# Patient Record
Sex: Female | Born: 1940 | Race: White | Hispanic: No | Marital: Married | Smoking: Former smoker
Health system: Southern US, Community
[De-identification: ages and names within clinical notes are randomized; demographics above are authoritative.]

## PROBLEM LIST (undated history)

## (undated) DIAGNOSIS — K7581 Nonalcoholic steatohepatitis (NASH): Secondary | ICD-10-CM

## (undated) DIAGNOSIS — C801 Malignant (primary) neoplasm, unspecified: Secondary | ICD-10-CM

## (undated) DIAGNOSIS — E119 Type 2 diabetes mellitus without complications: Secondary | ICD-10-CM

## (undated) DIAGNOSIS — I773 Arterial fibromuscular dysplasia: Secondary | ICD-10-CM

## (undated) DIAGNOSIS — T7840XA Allergy, unspecified, initial encounter: Secondary | ICD-10-CM

## (undated) DIAGNOSIS — I10 Essential (primary) hypertension: Secondary | ICD-10-CM

## (undated) DIAGNOSIS — I639 Cerebral infarction, unspecified: Secondary | ICD-10-CM

## (undated) HISTORY — DX: Type 2 diabetes mellitus without complications: E11.9

## (undated) HISTORY — DX: Allergy, unspecified, initial encounter: T78.40XA

## (undated) HISTORY — DX: Essential (primary) hypertension: I10

## (undated) HISTORY — PX: BREAST EXCISIONAL BIOPSY: SUR124

---

## 2001-12-14 ENCOUNTER — Other Ambulatory Visit: Admission: RE | Admit: 2001-12-14 | Discharge: 2001-12-14 | Payer: Self-pay | Admitting: Gynecology

## 2002-03-01 ENCOUNTER — Encounter: Payer: Self-pay | Admitting: Gastroenterology

## 2002-03-10 ENCOUNTER — Encounter (INDEPENDENT_AMBULATORY_CARE_PROVIDER_SITE_OTHER): Payer: Self-pay | Admitting: Specialist

## 2002-03-10 ENCOUNTER — Ambulatory Visit (HOSPITAL_COMMUNITY): Admission: RE | Admit: 2002-03-10 | Discharge: 2002-03-10 | Payer: Self-pay | Admitting: Gastroenterology

## 2003-01-03 ENCOUNTER — Other Ambulatory Visit: Admission: RE | Admit: 2003-01-03 | Discharge: 2003-01-03 | Payer: Self-pay | Admitting: Gynecology

## 2003-02-13 ENCOUNTER — Ambulatory Visit (HOSPITAL_COMMUNITY): Admission: RE | Admit: 2003-02-13 | Discharge: 2003-02-13 | Payer: Self-pay | Admitting: Gastroenterology

## 2003-02-13 ENCOUNTER — Encounter: Payer: Self-pay | Admitting: Gastroenterology

## 2003-11-30 ENCOUNTER — Ambulatory Visit (HOSPITAL_COMMUNITY): Admission: RE | Admit: 2003-11-30 | Discharge: 2003-11-30 | Payer: Self-pay | Admitting: General Surgery

## 2003-11-30 ENCOUNTER — Encounter (INDEPENDENT_AMBULATORY_CARE_PROVIDER_SITE_OTHER): Payer: Self-pay | Admitting: *Deleted

## 2003-11-30 ENCOUNTER — Ambulatory Visit (HOSPITAL_BASED_OUTPATIENT_CLINIC_OR_DEPARTMENT_OTHER): Admission: RE | Admit: 2003-11-30 | Discharge: 2003-11-30 | Payer: Self-pay | Admitting: General Surgery

## 2004-04-07 ENCOUNTER — Ambulatory Visit: Payer: Self-pay | Admitting: Pain Medicine

## 2004-04-28 ENCOUNTER — Ambulatory Visit: Payer: Self-pay | Admitting: Pain Medicine

## 2004-05-06 ENCOUNTER — Ambulatory Visit: Payer: Self-pay | Admitting: Pain Medicine

## 2004-07-22 ENCOUNTER — Encounter: Payer: Self-pay | Admitting: Neurosurgery

## 2004-08-06 ENCOUNTER — Encounter: Payer: Self-pay | Admitting: Neurosurgery

## 2004-09-03 ENCOUNTER — Encounter: Payer: Self-pay | Admitting: Neurosurgery

## 2005-02-04 ENCOUNTER — Other Ambulatory Visit: Admission: RE | Admit: 2005-02-04 | Discharge: 2005-02-04 | Payer: Self-pay | Admitting: Gynecology

## 2005-04-08 ENCOUNTER — Ambulatory Visit: Payer: Self-pay | Admitting: Otolaryngology

## 2005-06-03 ENCOUNTER — Ambulatory Visit: Payer: Self-pay

## 2005-07-06 DIAGNOSIS — C801 Malignant (primary) neoplasm, unspecified: Secondary | ICD-10-CM

## 2005-07-06 DIAGNOSIS — Z85038 Personal history of other malignant neoplasm of large intestine: Secondary | ICD-10-CM | POA: Insufficient documentation

## 2005-07-06 HISTORY — DX: Malignant (primary) neoplasm, unspecified: C80.1

## 2006-02-24 ENCOUNTER — Encounter: Admission: RE | Admit: 2006-02-24 | Discharge: 2006-02-24 | Payer: Self-pay | Admitting: Gastroenterology

## 2006-03-01 ENCOUNTER — Other Ambulatory Visit: Admission: RE | Admit: 2006-03-01 | Discharge: 2006-03-01 | Payer: Self-pay | Admitting: Gynecology

## 2006-05-07 ENCOUNTER — Ambulatory Visit: Payer: Self-pay | Admitting: Gynecology

## 2006-06-15 ENCOUNTER — Ambulatory Visit: Payer: Self-pay | Admitting: Gynecology

## 2006-12-22 ENCOUNTER — Ambulatory Visit: Payer: Self-pay | Admitting: Otolaryngology

## 2007-01-12 ENCOUNTER — Ambulatory Visit: Payer: Self-pay | Admitting: Otolaryngology

## 2007-02-28 ENCOUNTER — Ambulatory Visit: Payer: Self-pay | Admitting: Unknown Physician Specialty

## 2007-03-10 ENCOUNTER — Other Ambulatory Visit: Admission: RE | Admit: 2007-03-10 | Discharge: 2007-03-10 | Payer: Self-pay | Admitting: Gynecology

## 2007-04-06 ENCOUNTER — Ambulatory Visit: Payer: Self-pay | Admitting: Anesthesiology

## 2007-04-11 ENCOUNTER — Encounter: Payer: Self-pay | Admitting: Anesthesiology

## 2007-04-14 ENCOUNTER — Ambulatory Visit: Payer: Self-pay | Admitting: Anesthesiology

## 2007-04-28 ENCOUNTER — Ambulatory Visit: Payer: Self-pay | Admitting: Anesthesiology

## 2007-05-07 ENCOUNTER — Encounter: Payer: Self-pay | Admitting: Anesthesiology

## 2007-05-11 ENCOUNTER — Ambulatory Visit: Payer: Self-pay | Admitting: Anesthesiology

## 2007-06-17 ENCOUNTER — Ambulatory Visit: Payer: Self-pay | Admitting: Gynecology

## 2007-07-07 DIAGNOSIS — I10 Essential (primary) hypertension: Secondary | ICD-10-CM | POA: Insufficient documentation

## 2007-09-21 ENCOUNTER — Ambulatory Visit: Payer: Self-pay

## 2007-10-05 ENCOUNTER — Ambulatory Visit: Payer: Self-pay

## 2008-04-16 ENCOUNTER — Ambulatory Visit: Payer: Self-pay | Admitting: Gynecology

## 2008-04-16 ENCOUNTER — Other Ambulatory Visit: Admission: RE | Admit: 2008-04-16 | Discharge: 2008-04-16 | Payer: Self-pay | Admitting: Gynecology

## 2008-04-16 ENCOUNTER — Encounter: Payer: Self-pay | Admitting: Gynecology

## 2008-05-08 ENCOUNTER — Ambulatory Visit: Payer: Self-pay | Admitting: Gynecology

## 2008-06-20 ENCOUNTER — Ambulatory Visit: Payer: Self-pay | Admitting: Gynecology

## 2008-11-13 ENCOUNTER — Ambulatory Visit: Payer: Self-pay | Admitting: Anesthesiology

## 2008-12-05 ENCOUNTER — Ambulatory Visit: Payer: Self-pay | Admitting: Anesthesiology

## 2009-01-01 ENCOUNTER — Ambulatory Visit: Payer: Self-pay | Admitting: Anesthesiology

## 2009-02-05 ENCOUNTER — Emergency Department: Payer: Self-pay | Admitting: Emergency Medicine

## 2009-04-11 ENCOUNTER — Ambulatory Visit: Payer: Self-pay | Admitting: Obstetrics and Gynecology

## 2009-04-17 ENCOUNTER — Other Ambulatory Visit: Admission: RE | Admit: 2009-04-17 | Discharge: 2009-04-17 | Payer: Self-pay | Admitting: Gynecology

## 2009-04-17 ENCOUNTER — Encounter: Payer: Self-pay | Admitting: Gynecology

## 2009-04-17 ENCOUNTER — Ambulatory Visit: Payer: Self-pay | Admitting: Gynecology

## 2009-05-08 ENCOUNTER — Ambulatory Visit: Payer: Self-pay | Admitting: Women's Health

## 2009-05-28 ENCOUNTER — Encounter: Payer: Self-pay | Admitting: Infectious Diseases

## 2009-06-19 ENCOUNTER — Ambulatory Visit: Payer: Self-pay | Admitting: Gynecology

## 2009-07-08 ENCOUNTER — Ambulatory Visit: Payer: Self-pay | Admitting: Gynecology

## 2009-07-23 ENCOUNTER — Ambulatory Visit: Payer: Self-pay | Admitting: Gynecology

## 2009-07-30 ENCOUNTER — Ambulatory Visit: Payer: Self-pay | Admitting: Infectious Diseases

## 2009-07-30 DIAGNOSIS — N94810 Vulvar vestibulitis: Secondary | ICD-10-CM | POA: Insufficient documentation

## 2009-07-31 ENCOUNTER — Encounter: Payer: Self-pay | Admitting: Infectious Diseases

## 2009-09-05 ENCOUNTER — Encounter: Payer: Self-pay | Admitting: Infectious Diseases

## 2009-09-10 ENCOUNTER — Encounter: Payer: Self-pay | Admitting: Infectious Diseases

## 2009-10-23 ENCOUNTER — Ambulatory Visit: Payer: Self-pay | Admitting: Anesthesiology

## 2009-11-26 ENCOUNTER — Ambulatory Visit: Payer: Self-pay | Admitting: Anesthesiology

## 2009-12-30 ENCOUNTER — Ambulatory Visit: Payer: Self-pay | Admitting: Anesthesiology

## 2010-08-01 ENCOUNTER — Ambulatory Visit: Payer: Self-pay

## 2010-08-05 NOTE — Letter (Signed)
Summary: [facility],  [facility],   Imported By: Florinda Marker 09/26/2009 15:33:26  _____________________________________________________________________  External Attachment:    Type:   Image     Comment:   External Document

## 2010-08-05 NOTE — Consult Note (Signed)
Summary: New Pt. Referral:[facility]  New Pt. Referral:[facility]   Imported By: Florinda Marker 07/31/2009 15:14:07  _____________________________________________________________________  External Attachment:    Type:   Image     Comment:   External Document

## 2010-08-05 NOTE — Consult Note (Signed)
Summary: [facility]   Imported By: Florinda Marker 09/05/2009 16:55:59  _____________________________________________________________________  External Attachment:    Type:   Image     Comment:   External Document

## 2010-08-05 NOTE — Assessment & Plan Note (Signed)
Summary: new pt recurrent yeast   CC:  new patient / recurrent yeast.  History of Present Illness: PCP Alphonzo Lemmings - [facility] Dr Ardine Eng - GI liver - [facility] Dr Chauncey Mann - gi surgery Gyn - Dr Audie Box  70 yo with long standing history of vaginal yeast infections for her whole adult life.   Referred by Dr Audie Box for ID evaluation  Also has histroy off fatigue depression, gerd, allergic rhinitis.  Had colon cancer with hemicolectomy 2007 at [facility].   Has had multiple courses of diflucan for the vaginitis  Preventive Screening-Counseling & Management  Alcohol-Tobacco     Alcohol drinks/day: occassionally     Smoking Status: never  Caffeine-Diet-Exercise     Caffeine use/day: 0     Does Patient Exercise: yes     Type of exercise: gym membership     Exercise (avg: min/session): 30-60     Times/week: 3  Safety-Violence-Falls     Seat Belt Use: yes   Prior Medication List:  No prior medications documented  Current Allergies (reviewed today): ! PCN ! DARVOCET ! EPINEPHRINE ! IBUPROFEN ! * WHEAT INTOLERANCE ! * CONTRAST DYE Past History:  Family History: Last updated: 07/30/2009 no fam hx of colon cancer  Social History: Last updated: 07/30/2009 lives with husband , has 2 sons 5 grandkids no tobao or etoh  Risk Factors: Alcohol Use: occassionally (07/30/2009) Caffeine Use: 0 (07/30/2009) Exercise: yes (07/30/2009)  Risk Factors: Smoking Status: never (07/30/2009)  Past Medical History: Colon cancer 2007- [facility] resection. Depression Allergic rhinitis Labile secondary HTN  Fibromuscular dysplasia - s/p 3 angioplastys - follows with  [facility] - follows [facility] GI at [facility]  Past Surgical History: [facility] Hemicolectomy  Family History: no fam hx of colon cancer  Social History: lives with husband , has 2 sons 5 grandkids no tobao or etoh  Review of Systems       11 systems reviewed and negative except per HPI   Vital Signs:  Patient profile:   70 year old  female Height:      65 inches (165.10 cm) Weight:      153.8 pounds (69.91 kg) BMI:     25.69 Temp:     97.0 degrees F (36.11 degrees C) oral Pulse rate:   62 / minute BP sitting:   183 / 83  (right arm)  Vitals Entered By: Baxter Hire) (July 30, 2009 10:59 AM) CC: new patient / recurrent yeast Is Patient Diabetic? Yes Did you bring your meter with you today? Yes Pain Assessment Patient in pain? no      Nutritional Status BMI of 25 - 29 = overweight Nutritional Status Detail appetite is great per patient  Does patient need assistance? Functional Status Self care Ambulation Normal   Physical Exam  General:  alert and well-developed.   Head:  normocephalic and atraumatic.   Eyes:  vision grossly intact, pupils equal, and pupils round.   Neck:  supple.   Lungs:  normal respiratory effort and normal breath sounds.   Heart:  normal rate, regular rhythm, and no murmur.   Abdomen:  soft, non-tender, and normal bowel sounds.   Msk:  normal ROM, no joint tenderness, and no joint swelling.   Extremities:  no cce Neurologic:  alert & oriented X3, cranial nerves II-XII intact, and strength normal in all extremities.   Skin:  no rashes.     Impression & Recommendations:  Problem # 1:  VULVAR VESTIBULITIS (ICD-625.71) Referred for possible recurrent  vaginal yeast infections.   Likely vulvar vestibulitis not related to candidal infection.    I have sent vaginal yeast culture to eval for possible resistance but I doubt this will give an answer.   I have given recs regarding baking soda baths and lactobacillus supplements.  WIll refer to Dr Tessie Fass who specializes in this condition.   Orders: Consultation Level IV (60454) T-Culture, Fungus w/o Smear ([id number]) Gynecologic Referral (Gyn)  Other Orders: T-HIV Antibody  (Reflex) ([id number])  Patient Instructions: 1)   Take a baking soda bath 4 times a week. 2)  Get refrigerated probiotcs from [facility]. 3)  Call  for new or concerning symptoms.   4)  Follow up with Dr Vincente Poli. Process Orders Check Orders Results:     [facility]: Check successful Tests Sent for requisitioning (July 30, 2009 9:09 PM):     07/30/2009: [facility] -- T-Culture, Fungus w/o Smear [[id number]] (signed)     07/30/2009: [facility] -- T-HIV Antibody  (Reflex) [[id number]] (signed)   Process Orders Check Orders Results:     [facility]: Check successful Tests Sent for requisitioning (July 30, 2009 9:09 PM):     07/30/2009: [facility] -- T-Culture, Fungus w/o Smear [[id number]] (signed)     07/30/2009: [facility] -- T-HIV Antibody  (Reflex) [[id number]] (signed)

## 2010-11-21 NOTE — Op Note (Signed)
NAME:  Whitney [name], Whitney [name]                         ACCOUNT NO.:  [id number]   MEDICAL RECORD NO.:  [id number]                   PATIENT TYPE:  AMB   LOCATION:  DSC                                  FACILITY:  [facility]   PHYSICIAN:  Adolph Pollack, M.D.            DATE OF BIRTH:  [birth year]   DATE OF PROCEDURE:  11/30/2003  DATE OF DISCHARGE:                                 OPERATIVE REPORT   PREOPERATIVE DIAGNOSIS:  Symptomatic external hemorrhoid.   POSTOPERATIVE DIAGNOSIS:  Symptomatic external hemorrhoid.   OPERATION PERFORMED:  External hemorrhoidectomy.   SURGEON:  Adolph Pollack, M.D.   ANESTHESIA:  Plain Marcaine mixed with plain lidocaine.   INDICATIONS FOR PROCEDURE:  The patient is a 70 year old female who has had  an external hemorrhoid becoming increasingly symptomatic.  She has had it  for many years.  It is in the right anterolateral position.  She now  presents for excision of it.  The procedure and risks were discussed with  her preoperatively.   DESCRIPTION OF PROCEDURE:  She was brought to the minor room and placed in  the left lateral Sims position.  Hurricaine cream was placed around the  hemorrhoidal mucosa and the area was then sterilely prepped with Betadine.  A local anesthetic was then injected around the base of the external  hemorrhoid.  The area was sterilely prepped.  The hemorrhoid was then  sharply excised.  The anoderm was then reapproximated with running locking 3-  0 chromic suture with adequate hemostasis.  A bulky dressing was applied.  The patient tolerated the procedure well without any apparent complications  and was sent home in satisfactory condition.  She was given some Ultracet  for pain and also given some activity restrictions as well as postoperative  instructions.  Plan is to see her back again in two or three weeks.                                               Adolph Pollack, M.D.    Kari Baars  D:  11/30/2003  T:   12/01/2003  Job:  [id number]   cc:   Anselmo Rod, M.D.  [address]  [address]  [place]  [place] [address]  Fax: [phone]

## 2010-11-21 NOTE — Op Note (Signed)
TNAMEMIJA, EFFERTZ                        ACCOUNT NO.:  [id number]   MEDICAL RECORD NO.:  [id number]                   PATIENT TYPE:  AMB   LOCATION:  DAY                                  FACILITY:  [facility]   PHYSICIAN:  Charna Elizabeth, M.D.                   DATE OF BIRTH:  [birth year]   DATE OF PROCEDURE:  03/10/2002  DATE OF DISCHARGE:                                 OPERATIVE REPORT   PROCEDURE:  Colonoscopy with biopsies and snare polypectomy x1.   ENDOSCOPIST:  Charna Elizabeth, M.D.   INSTRUMENT USED:  Olympus video colonoscope.   INDICATIONS FOR PROCEDURE:  [age]-year-old white female with a history of  guaiac-positive stools.  Rule out colonic polyps, masses, hemorrhoids, etc.   PREPROCEDURE PREPARATION:  Informed consent was procured from the patient.  The patient was fasted for eight hours prior to the procedure and prepped  with a bottle of magnesium citrate and a gallon of NuLytely the night prior  to the procedure.   PREPROCEDURE PHYSICAL EXAMINATION:  VITAL SIGNS:  The patient had stable  vital signs.  NECK:  Supple.  LUNGS:  Clear to auscultation.  HEART:  S1, S2 regular.  ABDOMEN:  Soft with normal bowel sounds.   DESCRIPTION OF PROCEDURE:  The patient was placed in the left lateral  decubitus position.  She was under general anesthesia because of her  multiple medical allergies and inability to tolerate Demerol and Versed.  The patient has just finished cystoscopy which was done prior to the  colonoscopy.  Once the patient was adequately positioned and maintained on  oxygen, continuous cardiac monitoring, the Olympus video colonoscope was  advanced from the rectum to the cecum without difficulty.  The patient had a  lipomatous-appearing lesion in the mid right colon.  It was biopsied for  pathology.  The appendiceal orifice and ileocecal valve were clearly  visualized and photographed.  A small sessile polyp was snared from 20 cm.  Two small sessile diminutive  polyps were biopsied from 20 cm as well.  There  was no evidence of diverticulosis.  The patient tolerated the procedure well  without complications.   IMPRESSION:  1. Small lipomatous-appearing lesion biopsied from the mid right colon.  2. Small polyp snared from 20 cm.  3. Two small dimunitive polyps biopsied from 20 cm.  4. No evidence of diverticulosis or large masses.    RECOMMENDATIONS:  1. Await pathology results.  2. Avoid all nonsteroidals including aspirin.  3. Outpatient followup in the next two weeks.                                                    Charna Elizabeth, M.D.    JM/MEDQ  D:  03/10/2002  T:  03/10/2002  Job:  [id number]   cc:   Ivor Costa. Farrel Gobble, M.D.  [address] [place]  [place]  [place] [address]  Fax: [phone]   Lucrezia Starch. Ovidio Hanger, M.D.

## 2010-11-21 NOTE — Op Note (Signed)
NAME:  SUHEYLA, MORTELLARO                         ACCOUNT NO.:  [id number]   MEDICAL RECORD NO.:  [id number]                   PATIENT TYPE:  AMB   LOCATION:  DSC                                  FACILITY:  [facility]   PHYSICIAN:  Adolph Pollack, M.D.            DATE OF BIRTH:  [birth year]   DATE OF PROCEDURE:  DATE OF DISCHARGE:                                 OPERATIVE REPORT   Audio too short to transcribe (less than 5 seconds)                                               Adolph Pollack, M.D.    Kari Baars  D:  11/30/2003  T:  11/30/2003  Job:  [id number]

## 2010-11-21 NOTE — Op Note (Signed)
   TNAMEMARISA, [name]                        ACCOUNT NO.:  [id number]   MEDICAL RECORD NO.:  [id number]                   PATIENT TYPE:  AMB   LOCATION:  DAY                                  FACILITY:  [facility]   PHYSICIAN:  Ronald L. Ovidio Hanger, M.D.           DATE OF BIRTH:  1941-02-12   DATE OF PROCEDURE:  03/10/2002  DATE OF DISCHARGE:                                 OPERATIVE REPORT   DIAGNOSIS:  Possible interstitial cystitis.   OPERATIVE PROCEDURE:  1. Cystourethroscopy.  2. Hydraulic bladder distention.  3. Bladder biopsy.   SURGEON:  Lucrezia Starch. Earlene Plater, M.D.   ANESTHESIA:  General endotracheal.   ESTIMATED BLOOD LOSS:  Negligible.   TUBES:  None.   COMPLICATIONS:  None.   FINDINGS:  Submucosal glomerulations and bladder capacity 700 cc at 80 cm of  water.   INDICATIONS FOR PROCEDURE:  The patient is a lovely, 70 year old, white  female, who presents with urgency, frequency, bladder pain, pelvic  discomfort, and has multiple problems.  The situation was quite suspicious  for interstitial cystitis and after understanding risks, benefits, and  alternatives, elected to proceed with the above procedure in conjunction  with colonoscopy.   PROCEDURE IN DETAIL:  The patient was placed in a supine position.  After  proper general endotracheal anesthesia, was placed in the dorsal lithotomy  position and prepped and draped with Betadine in a sterile fashion.  Cystourethroscopy was performed with a 22.5 [place] Olympus panendoscope.  Utilizing the 12 and 70 degree lenses, the bladder was carefully inspected.  There was some grade 1 trabeculation, but efflux of clear urine was noted  from the normally placed ureteral orifices bilaterally.  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  Following this, a biopsy was taken from the  posterior midline with cold cup biopsy forcep, and the base was cauterized  with Bovie coagulation cautery.  The bladder was drained.  The panendoscope  was removed, and the case was turned over to Dr. Loreta Ave for colonoscopy.                                               Ronald L. Ovidio Hanger, M.D.    RLD/MEDQ  D:  03/10/2002  T:  03/10/2002  Job:  [id number]

## 2010-11-21 NOTE — Op Note (Signed)
Whitney [name], SWAMY                        ACCOUNT NO.:  [id number]   MEDICAL RECORD NO.:  [id number]                   PATIENT TYPE:  AMB   LOCATION:  DAY                                  FACILITY:  [facility]   PHYSICIAN:  Charna Elizabeth, M.D.                   DATE OF BIRTH:  December 15, 1940   DATE OF PROCEDURE:  07/05/2002  DATE OF DISCHARGE:                                 OPERATIVE REPORT   PROCEDURE PERFORMED:  Esophagogastroduodenoscopy with biopsies.   ENDOSCOPIST:  Charna Elizabeth, M.D.   INSTRUMENT USED:  Olympus video panendoscope.   INDICATIONS FOR PROCEDURE:  Guaiac-positive stools in a 70 year old white  female.  Rule out peptic ulcer disease, esophagitis, gastritis, etc.   PROCEDURE PREPARATION:  Informed consent was procured from the patient.  The  patient was fasted for eight hours prior to the procedure.   PREPROCEDURE PHYSICAL EXAMINATION:  VITAL SIGNS:  The patient has stable  vital signs.  NECK:  Supple.  CHEST:  Clear to auscultation.  HEART:  S1 and S2 regular.  ABDOMEN:  Soft with normal bowel sounds.   DESCRIPTION OF PROCEDURE:  The patient was placed in the left lateral  decubitus position.  She was general anesthesia.  Conscious sedation was not  used.  Once cystoscopy was completed by Dr. Gaynelle Arabian, the patient  underwent an EGD.  The Olympus video panendoscope was advanced through the  mouthpiece, over the tongue, into the esophagus under direct vision.  The  entire esophagus appeared normal with no evidence of ring, stricture,  masses, esophagitis or Barrett's mucosa.  The scope was then advanced into  the stomach.  There were some edematous gastric folds in the proximal half  of the stomach.  These were biopsied to rule out the presence of  Helicobacter pylori by pathology.  No ulcers, erosions, masses, or polyps  were present.  The proximal small bowel appeared normal as well.   IMPRESSION:  1. Normal appearing esophagus and proximal small bowel.  2. Edematous fold in the proximal stomach biopsied for pathology to rule out     Helicobacter pylori.  3. Normal appearing antrum.  No  ulcers, erosions, masses or polyps seen in     the stomach.    RECOMMENDATIONS:  1. Await pathology results.  2. Proceed with a colonoscopy at this time.                                               Charna Elizabeth, M.D.    JM/MEDQ  D:  03/10/2002  T:  03/10/2002  Job:  [id number]   cc:   Windy Fast L. Ovidio Hanger, M.D.   Ivor Costa. Farrel Gobble, M.D.  [address] [place]  [place]  [place] [address]  Fax: [phone]

## 2011-05-06 DIAGNOSIS — E669 Obesity, unspecified: Secondary | ICD-10-CM | POA: Insufficient documentation

## 2011-05-06 DIAGNOSIS — K760 Fatty (change of) liver, not elsewhere classified: Secondary | ICD-10-CM | POA: Insufficient documentation

## 2011-05-06 DIAGNOSIS — E785 Hyperlipidemia, unspecified: Secondary | ICD-10-CM | POA: Insufficient documentation

## 2011-08-13 ENCOUNTER — Ambulatory Visit: Payer: Self-pay

## 2012-07-26 ENCOUNTER — Ambulatory Visit: Payer: Self-pay | Admitting: Unknown Physician Specialty

## 2012-09-07 ENCOUNTER — Ambulatory Visit: Payer: Self-pay

## 2013-02-08 ENCOUNTER — Ambulatory Visit: Payer: Self-pay

## 2013-03-29 DIAGNOSIS — E559 Vitamin D deficiency, unspecified: Secondary | ICD-10-CM | POA: Insufficient documentation

## 2013-08-03 ENCOUNTER — Other Ambulatory Visit: Payer: Self-pay | Admitting: Obstetrics and Gynecology

## 2014-02-09 ENCOUNTER — Ambulatory Visit: Payer: Self-pay | Admitting: Obstetrics and Gynecology

## 2015-01-10 ENCOUNTER — Ambulatory Visit (INDEPENDENT_AMBULATORY_CARE_PROVIDER_SITE_OTHER): Payer: Medicare Other | Admitting: Podiatry

## 2015-01-10 ENCOUNTER — Encounter: Payer: Self-pay | Admitting: Podiatry

## 2015-01-10 ENCOUNTER — Ambulatory Visit (INDEPENDENT_AMBULATORY_CARE_PROVIDER_SITE_OTHER): Payer: Medicare Other

## 2015-01-10 VITALS — BP 135/64 | HR 70 | Resp 12

## 2015-01-10 DIAGNOSIS — M722 Plantar fascial fibromatosis: Secondary | ICD-10-CM

## 2015-01-10 MED ORDER — TRIAMCINOLONE ACETONIDE 10 MG/ML IJ SUSP
10.0000 mg | Freq: Once | INTRAMUSCULAR | Status: AC
  Administered 2015-01-10: 10 mg

## 2015-01-10 NOTE — Patient Instructions (Addendum)
Plantar Fasciitis  Plantar fasciitis is a common condition that causes foot pain. It is soreness (inflammation) of the band of tough fibrous tissue on the bottom of the foot that runs from the heel bone (calcaneus) to the ball of the foot. The cause of this soreness may be from excessive standing, poor fitting shoes, running on hard surfaces, being overweight, having an abnormal walk, or overuse (this is common in runners) of the painful foot or feet. It is also common in aerobic exercise dancers and ballet dancers.  SYMPTOMS   Most people with plantar fasciitis complain of:   Severe pain in the morning on the bottom of their foot especially when taking the first steps out of bed. This pain recedes after a few minutes of walking.   Severe pain is experienced also during walking following a long period of inactivity.   Pain is worse when walking barefoot or up stairs  DIAGNOSIS    Your caregiver will diagnose this condition by examining and feeling your foot.   Special tests such as X-rays of your foot, are usually not needed.  PREVENTION    Consult a sports medicine professional before beginning a new exercise program.   Walking programs offer a good workout. With walking there is a lower chance of overuse injuries common to runners. There is less impact and less jarring of the joints.   Begin all new exercise programs slowly. If problems or pain develop, decrease the amount of time or distance until you are at a comfortable level.   Wear good shoes and replace them regularly.   Stretch your foot and the heel cords at the back of the ankle (Achilles tendon) both before and after exercise.   Run or exercise on even surfaces that are not hard. For example, asphalt is better than pavement.   Do not run barefoot on hard surfaces.   If using a treadmill, vary the incline.   Do not continue to workout if you have foot or joint problems. Seek professional help if they do not improve.  HOME CARE INSTRUCTIONS     Avoid activities that cause you pain until you recover.   Use ice or cold packs on the problem or painful areas after working out.   Only take over-the-counter or prescription medicines for pain, discomfort, or fever as directed by your caregiver.   Soft shoe inserts or athletic shoes with air or gel sole cushions may be helpful.   If problems continue or become more severe, consult a sports medicine caregiver or your own health care provider. Cortisone is a potent anti-inflammatory medication that may be injected into the painful area. You can discuss this treatment with your caregiver.  MAKE SURE YOU:    Understand these instructions.   Will watch your condition.   Will get help right away if you are not doing well or get worse.  Document Released: 03/17/2001 Document Revised: 09/14/2011 Document Reviewed: 05/16/2008  [employer] Patient Information 2015 [employer]. This information is not intended to replace advice given to you by your health care provider. Make sure you discuss any questions you have with your health care provider.

## 2015-01-10 NOTE — Progress Notes (Signed)
   Subjective:    Patient ID: Whitney [name], female    DOB: [birth year], 74 y.o.   MRN: [id number]  HPI  PT STATED HAVE HISTORY OF PLANTAR FASCIITIS  LT FOOT HEEL AND NOW START BOTHER HER FOR HURTING FOR 1 MONTH. THE HEEL IS GETTING WORSE AND PRESSURE. TRIED NO TREATMENT.  Review of Systems  HENT: Positive for ear pain, hearing loss and sinus pressure.   Eyes: Positive for itching.  Gastrointestinal: Positive for nausea and diarrhea.  Genitourinary: Positive for frequency.  Musculoskeletal: Positive for gait problem.  Allergic/Immunologic: Positive for food allergies.       Objective:   Physical Exam        Assessment & Plan:

## 2015-01-10 NOTE — Progress Notes (Signed)
Subjective:     Patient ID: Rockne Coons, female   DOB: 1940-07-20, 74 y.o.   MRN: [id number]  HPI patient states I'm having a lot of pain in the plantar aspect of my left heel of 4 months duration and I know I'm flat-footed which is contributory   Review of Systems  All other systems reviewed and are negative.      Objective:   Physical Exam  Constitutional: She is oriented to person, place, and time.  Cardiovascular: Intact distal pulses.   Musculoskeletal: Normal range of motion.  Neurological: She is oriented to person, place, and time.  Skin: Skin is warm.  Nursing note and vitals reviewed.  neurovascular status intact muscle strength adequate and range of motion subtalar and midtarsal joint within normal limits. Patient's noted to have inflammation and pain of the plantar heel left insertional point of the tendon into the calcaneus with depression of the arch noted upon weightbearing     Assessment:     Plantar fasciitis left with inflammation and fluid around the medial band    Plan:     H&P and x-rays performed and injected the left plantar fascia 3 mg Kenalog 5 mg Xylocaine and instructed on physical therapy and inflammatory usage. Reappoint in 1 week

## 2015-01-18 ENCOUNTER — Encounter: Payer: Self-pay | Admitting: Podiatry

## 2015-01-18 ENCOUNTER — Ambulatory Visit (INDEPENDENT_AMBULATORY_CARE_PROVIDER_SITE_OTHER): Payer: Medicare Other | Admitting: Podiatry

## 2015-01-18 VITALS — BP 140/72 | HR 59 | Resp 15

## 2015-01-18 DIAGNOSIS — M722 Plantar fascial fibromatosis: Secondary | ICD-10-CM | POA: Diagnosis not present

## 2015-01-18 NOTE — Progress Notes (Signed)
Subjective:     Patient ID: Whitney [name], female   DOB: 1940/10/13, 74 y.o.   MRN: [id number]  HPI patient states my heel is feeling quite a bit better but I do have depression of my arch and I know I need some kind of an insert   Review of Systems     Objective:   Physical Exam Neurovascular status intact muscle strength adequate with depression of the arch noted upon weightbearing with discomfort in the plantar fascia of a mild to moderate nature but significantly improved from previous visit    Assessment:     Mechanical dysfunction of the foot with plantar fasciitis left present    Plan:     H&P and condition explained and advised on physical therapy supportive shoe gear usage. Scanned for custom orthotics to reduce stress against the foot

## 2015-01-18 NOTE — Patient Instructions (Signed)

## 2015-02-07 ENCOUNTER — Ambulatory Visit (INDEPENDENT_AMBULATORY_CARE_PROVIDER_SITE_OTHER): Payer: Medicare Other | Admitting: Podiatry

## 2015-02-07 ENCOUNTER — Encounter: Payer: Self-pay | Admitting: Podiatry

## 2015-02-07 VITALS — BP 145/69 | HR 70 | Resp 18

## 2015-02-07 DIAGNOSIS — M722 Plantar fascial fibromatosis: Secondary | ICD-10-CM

## 2015-02-09 NOTE — Progress Notes (Signed)
Subjective:     Patient ID: Whitney [name], female   DOB: December 12, 1940, 74 y.o.   MRN: [id number]  HPI patient presents stating I'm still getting some pain in my left heel but it seems to be improving slightly and I would like to know what other options there are if it remains tender   Review of Systems     Objective:   Physical Exam Neurovascular status found to be unchanged with muscle strength adequate range of motion adequate and patient's found to be oriented 3. Patient's noted to have continued discomfort in the medial and central band left plantar fascia with mild lateral compensation-like symptoms    Assessment:     Plantar fasciitis of the left heel and central heel band with continued inflammation and moderate depression of the arch noted    Plan:     Reviewed conditions and consideration of shockwave if symptoms persist. Today I went ahead and I scanned for custom orthotic devices that had been made previously and dispensed with all instructions on usage. Reappoint if symptoms continue

## 2015-05-08 DIAGNOSIS — E119 Type 2 diabetes mellitus without complications: Secondary | ICD-10-CM | POA: Insufficient documentation

## 2015-06-03 ENCOUNTER — Encounter: Payer: Self-pay | Admitting: Podiatry

## 2015-06-03 ENCOUNTER — Ambulatory Visit (INDEPENDENT_AMBULATORY_CARE_PROVIDER_SITE_OTHER): Payer: Medicare Other | Admitting: Podiatry

## 2015-06-03 VITALS — BP 158/70 | HR 62 | Resp 16

## 2015-06-03 DIAGNOSIS — M722 Plantar fascial fibromatosis: Secondary | ICD-10-CM

## 2015-06-04 NOTE — Progress Notes (Signed)
Subjective:     Patient ID: Whitney [name], female   DOB: [birth year], 74 y.o.   MRN: [id number]  HPI patient states she is still having pain in the plantar aspect of the left heel and it's not responded all the different methods the we have tried   Review of Systems     Objective:   Physical Exam Neurovascular status intact muscle strength adequate with continued discomfort plantar aspect left heel there is quite painful at the medial cut due to go fascial insertion with inflammation surrounding the area    Assessment:     Acute plantar fasciitis left with pain    Plan:     Reviewed condition and at this time recommended shockwave therapy and educated her on shockwave therapy procedure and complications and recovery. Today I applied air fracture walker that I want her to begin wearing now and she will wear during the postoperative period and she is scheduled for this procedure

## 2015-06-13 ENCOUNTER — Ambulatory Visit (INDEPENDENT_AMBULATORY_CARE_PROVIDER_SITE_OTHER): Payer: Medicare Other | Admitting: Podiatry

## 2015-06-13 ENCOUNTER — Ambulatory Visit: Payer: Medicare Other | Admitting: Podiatry

## 2015-06-13 DIAGNOSIS — M722 Plantar fascial fibromatosis: Secondary | ICD-10-CM

## 2015-06-16 NOTE — Progress Notes (Signed)
Subjective:     Patient ID: Whitney [name], female   DOB: [birth year], 74 y.o.   MRN: [id number]  HPI patient presents for shockwave therapy left   Review of Systems     Objective:   Physical Exam Neurovascular status intact with discomfort in the plantar heel left at the insertion of calcaneus    Assessment:     Planter fasciitis left with inflammation    Plan:     Shockwave administered 3000 shocks 17 frequency 4.0 intensity

## 2015-06-18 ENCOUNTER — Ambulatory Visit (INDEPENDENT_AMBULATORY_CARE_PROVIDER_SITE_OTHER): Payer: Medicare Other

## 2015-06-18 DIAGNOSIS — M722 Plantar fascial fibromatosis: Secondary | ICD-10-CM

## 2015-06-18 NOTE — Progress Notes (Signed)
   Subjective:    Patient ID: Whitney [name], female    DOB: 1941/04/14, 74 y.o.   MRN: [id number]  HPI {Pt presents for shockwave treatment EPAT #2, pt also states she has been able to start wearing her orthotics   Review of Systems    Neurovascular status intact with discomfort in plantar heel left foot at the insertion of calcaneus Objective:   Physical Exam   Plantar fasciitis left foot     Assessment & Plan:  Shockwave administered at 3000 shocks 17 frequency 4.3 intensity, tolerated well

## 2015-06-21 ENCOUNTER — Other Ambulatory Visit: Payer: Medicare Other

## 2015-06-24 ENCOUNTER — Ambulatory Visit (INDEPENDENT_AMBULATORY_CARE_PROVIDER_SITE_OTHER): Payer: Medicare Other

## 2015-06-24 DIAGNOSIS — M722 Plantar fascial fibromatosis: Secondary | ICD-10-CM

## 2015-06-24 NOTE — Progress Notes (Signed)
HPI {Pt presents for shockwave treatment EPAT #2, pt also states she has been able to start wearing her orthotics   Review of Systems    Neurovascular status intact with discomfort in plantar heel left foot at the insertion of calcaneus Objective:   Physical Exam   Plantar fasciitis left foot     Assessment & Plan:  Shockwave administered at 3000 shocks 17 frequency 4.3 intensity, tolerated well

## 2015-07-15 ENCOUNTER — Ambulatory Visit: Payer: Medicare Other | Admitting: Physical Therapy

## 2015-07-17 ENCOUNTER — Ambulatory Visit: Payer: Medicare Other | Admitting: Physical Therapy

## 2015-07-18 ENCOUNTER — Ambulatory Visit: Payer: Medicare Other | Attending: Pediatrics | Admitting: Physical Therapy

## 2015-07-18 ENCOUNTER — Encounter: Payer: Self-pay | Admitting: Physical Therapy

## 2015-07-18 DIAGNOSIS — M545 Low back pain, unspecified: Secondary | ICD-10-CM

## 2015-07-18 DIAGNOSIS — M62830 Muscle spasm of back: Secondary | ICD-10-CM

## 2015-07-19 NOTE — Therapy (Signed)
[name] [address] [place], [address] Phone: [phone]   Fax:  [phone]  Physical Therapy Evaluation  Patient Details  Name: Whitney [name] MRN: [id number] Date of Birth: 11/14/1940 Referring Provider: Ned Card, MD  Encounter Date: 07/18/2015      PT End of Session - 07/18/15 1000    Visit Number 1   Number of Visits 12   Date for PT Re-Evaluation 08/29/15   Authorization Type 1   Authorization Time Period 10 G code   PT Start Time 0925   PT Stop Time 1030   PT Time Calculation (min) 65 min   Activity Tolerance Patient tolerated treatment well   Behavior During Therapy [facility] for tasks assessed/performed      Past Medical History  Diagnosis Date  . Hypertension   . Diabetes mellitus without complication ([place])   . Allergy     History reviewed. No pertinent past surgical history.  There were no vitals filed for this visit.  Visit Diagnosis:  Muscle spasm of back - Plan: PT plan of care cert/re-cert  Left-sided low back pain without sciatica - Plan: PT plan of care cert/re-cert      Subjective Assessment - 07/18/15 0949    Subjective Patient reports having lower back pain just under ribs (L2-4/5). The pain is currently improving and is now burning. She is still unable to perform her daily tasks like she used to and is not attending exercise class because of her spasms and pain.    Pertinent History Early december 2016 patient reports she had a dog that slipped down stairs and patient picked up her dog (20#). She reports she did have immediate pain and is improving slowly at this time. pain is described as burning pain.    Limitations Lifting;House hold activities;Sitting   How long can you sit comfortably? about an hour   How long can you stand comfortably? 30 min   How long can you walk comfortably? 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 Yes   Pain Score 3    Pain Location Back   Pain Orientation Lower   Pain Descriptors / Indicators Burning;Aching   Pain Type Acute pain   Pain Radiating Towards towards left LE   Pain Onset More than a month ago   Pain Frequency Intermittent   Aggravating Factors  lifting books, groceries that weigh too much such as canned fruit,    Pain Relieving Factors medication for pain, aspercream and ice   Effect of Pain on Daily Activities moving slower than normal for household activities and community activities and unable to go to exercise class   Multiple Pain Sites No            [facility] PT Assessment - 07/18/15 0928    Assessment   Medical Diagnosis Back strain initial encounter [id number])   Referring Provider Ned Card, MD   Onset Date/Surgical Date 06/06/15   Hand Dominance Right   Next MD Visit 08/2015   Prior Therapy none   Precautions   Precautions None   Restrictions   Weight Bearing Restrictions No   Balance Screen   Has the patient fallen in the past 6 months No   Has the patient had a decrease in activity level because of a fear  of falling?  No   Is the patient reluctant to leave their home because of a fear of falling?  No   Home Ec[employer] residence   Living Arrangements Spouse/significant other   [place] to enter   Entrance Stairs-Number of Steps 2   Entrance Stairs-Rails None   Home Layout Two level   Alternate Level Stairs-Number of Steps 10   Alternate Level Stairs-Rails Left  going up   [place] - built in   Prior Function   Level of Independence Independent   Vocation Retired   Leisure read, play cards, go out for lunch, exercise at [employer]   Overall Cognitive Status Within Functional Limits for tasks assessed        Objective: Lumbar spine: AROM 50% limited forward flexion,  extension, WFL's for lateral flexion right and left, hip ROM WFL's bilaterally Strength: grossly WNL's major muscle groups both LE's and UE's with some pain with abduction right shoulder (patient reports having bursitis) Palpation: with patient standing and prone lying + moderate sized spasm noted on left side of lower back lumbar region Joint mobility: decreased PA mobility throughout spine thoracic to lumbar region Special tests; negative reproduction of symptom with SLR, crossed SLR, slump test  Treatment:  Soft tissue mobilization with patient prone lying along lumbar spine, joint mobilization PA glides gentle grade I-II lumbar spine 3 sets 5 reps each segment Exercise: instructed in ROM in prone lying over pillow rest 5 min., prop on elbows 2 min. 2 x day, use of ice/heat for pain control  Patient response to treatment: improved soft tissue elasticity with decreased spasm 50% and improved joint mobility and ability to perform ROM exercises with verbal cuing         PT Long Term Goals - 07/18/15 1040    PT LONG TERM GOAL #1   Title Patient will demonstrate improved function with daily tasks with mild to no back symptoms as indicated by Modified Oswestry score of 20% or less by 2/23//2017   Baseline MODI 36% (moderate self perceived disability)   Status New   PT LONG TERM GOAL #2   Title Patient will be able to return to exercise class without limitaitons by 2/23//2017   Baseline unable to attend class at this time due to pain/stiffness/spasms   Status New   PT LONG TERM GOAL #3   Title Patient will ben independent with home program without cuing for pain control, exercises for flexiblity and strength to be able to self manage symptoms to continue progressing towards prior level of funciton by 08/29/2015   Baseline limited knowledge of appropriate pain control strategies/exercise progression to achieve goals and return to prior level of funciton   Status New                Plan - 07/18/15 1040    Clinical Impression Statement Patient is a 75 year old female who presents with strain of lumbar spine from picking up her 18 pound dog as it was falling down the stairs. She is currently having spasms and pain that is improving at this time. She is limited in all of her daily activties for household chores and unable to go to exercise class due to currrent pain level and limitaitons. She has limited knowledge of appropriate pain control strategies and progression of exercises in order to retunr to prior level of funciton and be able tor resume her exercise class.  Pt will benefit from skilled therapeutic intervention in order to improve on the following deficits Decreased strength;Pain;Impaired flexibility;Hypomobility;Increased muscle spasms;Decreased range of motion   Rehab Potential Good   Clinical Impairments Affecting Rehab Potential (+) active, motivated  (-) chronic back problems, hernia surgeries with limitations with lifting >5#, age   PT Frequency 2x / week   PT Duration 6 weeks   PT Treatment/Interventions Manual techniques;Patient/family education;Electrical Stimulation;Therapeutic exercise;Moist Heat   PT Next Visit Plan pain control, manual techniques for spasms, progressive ROM/strengthening exercises as indicated   PT Home Exercise Plan ROM exercises, ice/heat to control pain   Consulted and Agree with Plan of Care Patient          G-Codes - 2015/07/28 1040    Functional Assessment Tool Used modified oswestry, pain scale, ROM deficits, strength deficits, clinical judgment   Functional Limitation Mobility: Walking and moving around   Mobility: Walking and Moving Around Current Status [phone]) At least 20 percent but less than 40 percent impaired, limited or restricted   Mobility: Walking and Moving Around Goal Status [phone]) At least 1 percent but less than 20 percent impaired, limited or restricted       Problem List Patient Active Problem List    Diagnosis Date Noted  . VULVAR VESTIBULITIS 07/30/2009    [name] PT 07/19/2015, 2:25 PM  [facility] [address], [place], [address] Phone: [phone]   Fax:  [phone]  Name: Whitney [name] MRN: [id number] Date of Birth: Mar 01, 1941

## 2015-07-22 ENCOUNTER — Ambulatory Visit (INDEPENDENT_AMBULATORY_CARE_PROVIDER_SITE_OTHER): Payer: Medicare Other | Admitting: Podiatry

## 2015-07-22 ENCOUNTER — Encounter: Payer: Self-pay | Admitting: Podiatry

## 2015-07-22 DIAGNOSIS — M722 Plantar fascial fibromatosis: Secondary | ICD-10-CM | POA: Diagnosis not present

## 2015-07-22 NOTE — Progress Notes (Signed)
Subjective:     Patient ID: Whitney [name], female   DOB: June 28, 1941, 75 y.o.   MRN: [id number]  HPI patient states I'm still having pain in my heel and it seems like one spot is still getting me but I am somewhat better than I was previously but worse when I get up in the morning still   Review of Systems     Objective:   Physical Exam Neurovascular status intact muscle strength adequate with patient having discomfort in the plantar medial heel and one specific area that still tender when pressed    Assessment:     Plantar fasciitis left improved but present    Plan:     Shockwave and a lower level administered and today I did discuss continued conservative care and not going barefoot and I dispensed night splint with instructions on usage to try to provide for complete stretching activity. Reappoint 4 weeks and may require other treatments

## 2015-07-23 ENCOUNTER — Encounter: Payer: Self-pay | Admitting: Physical Therapy

## 2015-07-23 ENCOUNTER — Ambulatory Visit: Payer: Medicare Other | Admitting: Physical Therapy

## 2015-07-23 DIAGNOSIS — M62830 Muscle spasm of back: Secondary | ICD-10-CM

## 2015-07-23 DIAGNOSIS — M545 Low back pain, unspecified: Secondary | ICD-10-CM

## 2015-07-23 NOTE — Therapy (Signed)
[facility] [address] [place], [address] Phone: [phone]   Fax:  [phone]  Physical Therapy Treatment  Patient Details  Name: Whitney [name] MRN: [id number] Date of Birth: [birth year] Referring Provider: Ned Card, MD  Encounter Date: 07/23/2015      PT End of Session - 07/23/15 1100    Visit Number 2   Number of Visits 12   Date for PT Re-Evaluation 08/29/15   Authorization Type 2   Authorization Time Period 10 G code   PT Start Time 1019   PT Stop Time 1100   PT Time Calculation (min) 41 min   Activity Tolerance Patient tolerated treatment well   Behavior During Therapy [facility] for tasks assessed/performed      Past Medical History  Diagnosis Date  . Hypertension   . Diabetes mellitus without complication ([place])   . Allergy     History reviewed. No pertinent past surgical history.  There were no vitals filed for this visit.  Visit Diagnosis:  Muscle spasm of back  Left-sided low back pain without sciatica      Subjective Assessment - 07/23/15 1020    Subjective Patient reports having lower back pain just under ribs (L2-4/5)improving since previous session. Pain has had moments of no pain in her back since previous session. She is eager to return to exercise class.    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 Yes   Pain Score 3    Pain Location Back   Pain Orientation Lower   Pain Descriptors / Indicators Aching   Pain Radiating Towards none today   Pain Frequency Intermittent       Objective: Palpation: + spasms palpable along left side of back lower thoracic through lumbar region ~50% less than previous session, decreased mobility throughout spine Posture: guarded, decreased lumbar lordosis       [facility] Adult PT Treatment/Exercise - 07/23/15 1023    Exercises   Exercises Other Exercises   Other Exercises  patient performed exercises with guidance, verbal and tactile cues and demonstration of PT: sitting hip adduction with glute sets with ball between knees with demonstration and verbal cues to perform correctly with proper technique and engage TrA  x 10 reps, hip abduction with resistive band x 15 reps, side lying clam x 10 reps each LE, standing hip abduction, extension 3 x 5 reps each, walk forward, backwards and side stepping along counter     Manual Therapy   Manual Therapy Soft tissue mobilization   Manual therapy comments spasms mid to lower back left side   Soft tissue mobilization STM performed by PT with superficiasl techniques to relieve spasms and improve soft tissue elasticity with patient prone lying on treatment table       Patient response to treatment: improved soft tissue elasticity with decreased spasms and only mild pain following session, improved technique with exercises following demonstration, verbal and tactile cues and repetition          PT Education - 07/23/15 1100    Education provided Yes   Education Details HEP: hip stabilization, correct technique for TrA contraction during exercises, standing hip extension, abduction, walk forward, backward, side stepping   Person(s) Educated Patient   Methods Explanation;Demonstration;Verbal cues;Handout   Comprehension Verbalized understanding;Returned demonstration;Verbal cues required  PT Long Term Goals - 07/18/15 1040    PT LONG TERM GOAL #1   Title Patient will demonstrate improved function with daily tasks with mild to no back symptoms as indicated by Modified Oswestry score of 20% or less by 2/23//2017   Baseline MODI 36% (moderate self perceived disability)   Status New   PT LONG TERM GOAL #2   Title Patient will be able to return to exercise class without limitaitons by 2/23//2017   Baseline unable to attend class at this time due to  pain/stiffness/spasms   Status New   PT LONG TERM GOAL #3   Title Patient will ben independent with home program without cuing for pain control, exercises for flexiblity and strength to be able to self manage symptoms to continue progressing towards prior level of funciton by 08/29/2015   Baseline limited knowledge of appropriate pain control strategies/exercise progression to achieve goals and return to prior level of funciton   Status New               Plan - 07/23/15 1100    Clinical Impression Statement Patient is progressing with decreasing spasms and pain in back. She is progressing well with exercises with demonstration and verbal cues. She continues with limitiation with daily tasks and will benefit from additional physical therapy intervention.    Pt will benefit from skilled therapeutic intervention in order to improve on the following deficits Decreased strength;Pain;Impaired flexibility;Hypomobility;Increased muscle spasms;Decreased range of motion   Rehab Potential Good   PT Frequency 2x / week   PT Duration 6 weeks   PT Treatment/Interventions Manual techniques;Patient/family education;Electrical Stimulation;Therapeutic exercise;Moist Heat   PT Next Visit Plan pain control, manual techniques for spasms, progressive ROM/strengthening exercises as indicated   PT Home Exercise Plan progress with stabilization exercises        Problem List Patient Active Problem List   Diagnosis Date Noted  . VULVAR VESTIBULITIS 07/30/2009    Aldona Lento 07/24/2015, 9:21 AM  [facility] [address], [place], [address] Phone: [phone]   Fax:  [phone]  Name: Whitney [name] MRN: [id number] Date of Birth: [birth year]

## 2015-07-25 ENCOUNTER — Encounter: Payer: Self-pay | Admitting: Physical Therapy

## 2015-07-25 ENCOUNTER — Ambulatory Visit: Payer: Medicare Other | Admitting: Physical Therapy

## 2015-07-25 DIAGNOSIS — M545 Low back pain, unspecified: Secondary | ICD-10-CM

## 2015-07-25 DIAGNOSIS — M62830 Muscle spasm of back: Secondary | ICD-10-CM | POA: Diagnosis not present

## 2015-07-25 NOTE — Therapy (Signed)
[facility] [address], [place], [address] Phone: [phone]   Fax:  [phone]  Physical Therapy Treatment  Patient Details  Name: Whitney [name] MRN: [id number] Date of Birth: [birth year] Referring Provider: Ned Card, MD  Encounter Date: 07/25/2015      PT End of Session - 07/25/15 1624    Visit Number 3   Number of Visits 12   Date for PT Re-Evaluation 08/29/15   Authorization Type 3   Authorization Time Period 10 G code   PT Start Time 1533   PT Stop Time 1612   PT Time Calculation (min) 39 min   Activity Tolerance Patient tolerated treatment well   Behavior During Therapy [facility] for tasks assessed/performed      Past Medical History  Diagnosis Date  . Hypertension   . Diabetes mellitus without complication ([place])   . Allergy     History reviewed. No pertinent past surgical history.  There were no vitals filed for this visit.  Visit Diagnosis:  Muscle spasm of back  Left-sided low back pain without sciatica      Subjective Assessment - 07/25/15 1541    Subjective Patient reports having lower back pain just under ribs (L2-4/5) more stiffness and burning since previous session. She has been exercising as instructed and doing other back exercises she learned in previous PT sessions years ago.    Limitations Lifting;House hold activities;Sitting   Currently in Pain? Yes   Pain Score 4    Pain Location Back   Pain Orientation Lower   Pain Descriptors / Indicators Aching   Pain Type Acute pain   Pain Radiating Towards none    Pain Onset More than a month ago   Pain Frequency Intermittent        Objective: AROM lumbar spine: forward flexion to ankles, extension limited 50%, lateral flexion hand to knees with each side of back sore/tender with ROM to same side Palpation: increased spasms along bilateral lumbar spine paraspinal muscles, decreased joint mobility throughout  lumbar spine       [facility] Adult PT Treatment/Exercise - 07/25/15 1543    Exercises   Exercises Other Exercises   Other Exercises  patient performed exercises with guidance, verbal and tactile cues and demonstration of PT: sitting hip adduction with glute sets with ball hold 2-3 seconds x 10 reps, roll ball under each foot x 1 min., standing hip abduction with toe tap, extension with toe tap 3 x 3 reps each LE, hip abduction with resistive band x 15 reps, side lying clam up to 10 reps each LE with tactile and verbal cues for correct alignment of hip/knee   Manual Therapy   Manual Therapy Soft tissue mobilization   Manual therapy comments spasms mid to lower back both sides   Soft tissue mobilization STM superficiasl techniques to relieve spasms and improve soft tissue elasticity with patient prone lying       Patient response to treatment: improved soft tissue elasticity with decreased tenderness and pain in lower back, able to perform exercises with modification of less repetitions and through partial ROM, required verbal and tactile cues for correct technique and alignment of trunk, hip, knees during exercises, demonstrated good technique for golfer's bend to pick up things from floor  Patient Education: instructed in proper technique for log rolling, picking things up off floor using golfer's bend, modifying exercises to perform a few each day and decrease intensity/repetitions as needed  PT Long Term Goals - 07/18/15 1040    PT LONG TERM GOAL #1   Title Patient will demonstrate imrpoved function with daily tasks with mild to no back symptoms as indicated by Modified Oswestry score of 20% or less by 2/23//2017   Baseline MODI 36% (moderate self perceived disability)   Status New   PT LONG TERM GOAL #2   Title Patient will be able to return to exercise class without limitaitons by 2/23//2017   Baseline unable to attend class at this time due to pain/stiffness/spasms   Status New    PT LONG TERM GOAL #3   Title Patient will ben independent with home program without cuing for pain control, exercises for flexiblity and strength to be able to self manage symptoms to continue progressing towards prior level of funciton by 08/29/2015   Baseline limited knowledge of appropriate pain control strategies/exercise progression to achieve goals and return to prior level of funciton   Status New               Plan - 07/25/15 1626    Clinical Impression Statement Patient demonstrated decreased spasms 25% and decreased burning to mild at end of session. She requires guidance and instruction to perform exercises with appropriate posture, technique and intensity as she heals from muscle strain.    Pt will benefit from skilled therapeutic intervention in order to improve on the following deficits Decreased strength;Pain;Impaired flexibility;Hypomobility;Increased muscle spasms;Decreased range of motion   Rehab Potential Good   PT Frequency 2x / week   PT Duration 6 weeks   PT Treatment/Interventions Manual techniques;Patient/family education;Electrical Stimulation;Therapeutic exercise;Moist Heat   PT Next Visit Plan pain control, manual techniques for spasms, progressive ROM/strengthening exercises as indicated        Problem List Patient Active Problem List   Diagnosis Date Noted  . VULVAR VESTIBULITIS 07/30/2009    [name] PT 07/26/2015, 8:30 AM  [facility] [address], [place], [address] Phone: [phone]   Fax:  [phone]  Name: Whitney [name] MRN: [id number] Date of Birth: Oct 24, 1940

## 2015-07-30 ENCOUNTER — Encounter: Payer: Self-pay | Admitting: Physical Therapy

## 2015-07-30 ENCOUNTER — Ambulatory Visit: Payer: Medicare Other | Admitting: Physical Therapy

## 2015-07-30 DIAGNOSIS — M62830 Muscle spasm of back: Secondary | ICD-10-CM

## 2015-07-30 DIAGNOSIS — M545 Low back pain, unspecified: Secondary | ICD-10-CM

## 2015-07-30 NOTE — Therapy (Signed)
[facility] [address], [place], [address] Phone: [phone]   Fax:  [phone]  Physical Therapy Treatment  Patient Details  Name: Whitney [name] MRN: [id number] Date of Birth: Nov 08, 1940 Referring Provider: Ned Card, MD  Encounter Date: 07/30/2015      PT End of Session - 07/30/15 1058    Visit Number 4   Number of Visits 12   Date for PT Re-Evaluation 08/29/15   Authorization Type 4   Authorization Time Period 10 G code   PT Start Time 1009   PT Stop Time 1053   PT Time Calculation (min) 44 min   Activity Tolerance Patient tolerated treatment well   Behavior During Therapy [facility] for tasks assessed/performed      Past Medical History  Diagnosis Date  . Hypertension   . Diabetes mellitus without complication ([place])   . Allergy     History reviewed. No pertinent past surgical history.  There were no vitals filed for this visit.  Visit Diagnosis:  Muscle spasm of back  Left-sided low back pain without sciatica      Subjective Assessment - 07/30/15 1010    Subjective Patient reports she is seeing improvment in general and did go back to exercise class with modifications. She is getting up in am and not having pain in back and then as she moves and bends for things such as clothes washing she then notices increaed burning in back.    Limitations Lifting;House hold activities;Sitting   How long can you sit comfortably? >1 hour   How long can you stand comfortably? up to an hour   How long can you walk comfortably? >30 min.   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 Yes   Pain Score 4    Pain Location Back   Pain Orientation Lower  more to left today   Pain Descriptors / Indicators Aching;Burning   Pain Type Acute pain  06/2015   Pain Radiating Towards none   Pain Onset More  than a month ago   Aggravating Factors  bending activties (doing clothes), heavy lifting   Pain Relieving Factors medication for pain, aspercream, biofreeze, ice   Effect of Pain on Daily Activities has to be cautious with doing household chores now and able to move more normally, can't walk up stairs         Objective: Palpation: lower back left side with spasms noted L2/3 to L5 region         [facility] Adult PT Treatment/Exercise - 07/30/15 1018    Exercises   Exercises Other Exercises   Other Exercises  patient performed exercises with guidance, verbal and tactile cues and demonstration of PT: seated hip stabilization with ball in sitting with pushing heels into floor x 10 reps, band around thighs for hip abduction both LE's and single leg x 15 reps each, standing side stepping 2x to right and left x 5 sets, walk against resistive band x 5 repos forward and backwards, hamstring stretching with correct position without increased back flexion with verbal cues and demonstration,     Manual Therapy   Manual Therapy Soft tissue mobilization   Manual therapy comments spasms mid to lower back left side   Soft tissue mobilization STM superficiasl techniques to relieve spasms and improve soft tissue elasticity with patient seated in massage  chair       Patient response to treatment: decreased spasms by 50% in lower back left side, improved ability to perform exercises and stretching hamstring with improved position/posture with repetition and demonstration/verbal cuing         PT Education - 07/30/15 1050    Education provided Yes   Education Details HEP for hip abduction and side stepping with resistive band around thighs, modified exercises for stabilization   Person(s) Educated Patient   Methods Explanation;Demonstration;Verbal cues   Comprehension Verbalized understanding;Returned demonstration;Verbal cues required             PT Long Term Goals - 07/18/15 1040    PT LONG  TERM GOAL #1   Title Patient will demonstrate imrpoved function with daily tasks with mild to no back symptoms as indicated by Modified Oswestry score of 20% or less by 2/23//2017   Baseline MODI 36% (moderate self perceived disability)   Status New   PT LONG TERM GOAL #2   Title Patient will be able to return to exercise class without limitaitons by 2/23//2017   Baseline unable to attend class at this time due to pain/stiffness/spasms   Status New   PT LONG TERM GOAL #3   Title Patient will ben independent with home program without cuing for pain control, exercises for flexiblity and strength to be able to self manage symptoms to continue progressing towards prior level of funciton by 08/29/2015   Baseline limited knowledge of appropriate pain control strategies/exercise progression to achieve goals and return to prior level of funciton   Status New               Plan - 07/30/15 1100    Clinical Impression Statement Patient is progressing well with decreasing spasms and pain in back and transitioning back to exercise classes with modifications to avoid back pain. She continues with moderate spasms on left side lower lumbar paraspinal muscles and  will continue to require physical therapy intervention to achieve goals and return to prior level of function.    Pt will benefit from skilled therapeutic intervention in order to improve on the following deficits Decreased strength;Pain;Impaired flexibility;Hypomobility;Increased muscle spasms;Decreased range of motion   Rehab Potential Good   PT Frequency 2x / week   PT Duration 6 weeks   PT Treatment/Interventions Manual techniques;Patient/family education;Electrical Stimulation;Therapeutic exercise;Moist Heat;Ultrasound   PT Next Visit Plan pain control, manual techniques for spasms, progressive ROM/strengthening exercises as indicated        Problem List Patient Active Problem List   Diagnosis Date Noted  . VULVAR VESTIBULITIS  07/30/2009    [name] PT 07/30/2015, 3:00 PM  [facility] [address], [place], [address] Phone: [phone]   Fax:  [phone]  Name: SARALEE GENTZEL MRN: [id number] Date of Birth: 10-10-1940

## 2015-08-01 ENCOUNTER — Ambulatory Visit: Payer: Medicare Other | Admitting: Physical Therapy

## 2015-08-01 ENCOUNTER — Encounter: Payer: Self-pay | Admitting: Physical Therapy

## 2015-08-01 DIAGNOSIS — M545 Low back pain, unspecified: Secondary | ICD-10-CM

## 2015-08-01 DIAGNOSIS — M62830 Muscle spasm of back: Secondary | ICD-10-CM

## 2015-08-02 NOTE — Therapy (Signed)
[facility] [address] [place], [address] Phone: [phone]   Fax:  [phone]  Physical Therapy Treatment  Patient Details  Name: Whitney [name] MRN: [id number] Date of Birth: January 13, 1941 Referring Provider: Ned Card, MD  Encounter Date: 08/01/2015      PT End of Session - 08/01/15 1127    Visit Number 5   Number of Visits 12   Date for PT Re-Evaluation 08/29/15   Authorization Type 5   Authorization Time Period 10 G code   PT Start Time 1039   PT Stop Time 1128   PT Time Calculation (min) 49 min   Activity Tolerance Patient tolerated treatment well   Behavior During Therapy [facility] for tasks assessed/performed      Past Medical History  Diagnosis Date  . Hypertension   . Diabetes mellitus without complication ([place])   . Allergy     History reviewed. No pertinent past surgical history.  There were no vitals filed for this visit.  Visit Diagnosis:  Muscle spasm of back  Left-sided low back pain without sciatica      Subjective Assessment - 08/01/15 1042    Subjective Patient reports she is seeing improvement and is noticing that she is not having pain in the morning on waking. She is having increased stomach/abdominal soreness and requests holding off on exercises with resistive bands because she feels this may be aggravating her symptoms.    Limitations Lifting;House hold activities;Sitting   Patient Stated Goals Patient would like to get back to young at heart program and return to all activities without a problem   Currently in Pain? Yes   Pain Score 2    Pain Location Back   Pain Orientation --  middle lower   Pain Descriptors / Indicators Burning;Aching   Pain Type Acute pain  12/206   Pain Onset More than a month ago   Pain Frequency Intermittent      Objective: Posture: standing and sitting note increased soft tissue spasms on left side lumbar spine  Palpation: +  spasms with tenderness elicited along paraspinal muscles left side lumbar spine L3-5 region        [facility] Adult PT Treatment/Exercise - 08/01/15 1047               Manual Therapy   Manual Therapy Soft tissue mobilization   Manual therapy comments spasms mid to lower back left side   Soft tissue mobilization STM performed by PT using superficiasl techniques to relieve spasms and improve soft tissue elasticity in lumbar spine with concentration on left side, patient seated in massage chair for treatment      Ultrasound: 3MHz pulsed 20% 1.4w/cm2 x 10 min. Applied by therapist to left side lumbar spine L3-5 region over spasms/painful region with patient seated in massage chair followed by high volt estim.  Electrical Stimulation: High volt for muscle spasms with continuous mode, (4) electrodes applied to lumbar spine bilaterally with patient seated in massage chair x 20 min.  Patient response to treatment: improved soft tissue elasticity with decreased tenderness and spasm by 50%, patient reported decreased pain to mild/none following estim. As well. She demonstrated good verbal understanding of home exercises to continue for home       PT Education - 08/01/15 1120    Education provided Yes   Education Details educated in use of high volt estim to help with spasms and pain in order for her to be able  to progress exercises without increased spasms/pain   Person(s) Educated Patient   Methods Explanation   Comprehension Verbalized understanding             PT Long Term Goals - 07/18/15 1040    PT LONG TERM GOAL #1   Title Patient will demonstrate imrpoved function with daily tasks with mild to no back symptoms as indicated by Modified Oswestry score of 20% or less by 2/23//2017   Baseline MODI 36% (moderate self perceived disability)   Status New   PT LONG TERM GOAL #2   Title Patient will be able to return to exercise class without limitaitons by 2/23//2017   Baseline unable to  attend class at this time due to pain/stiffness/spasms   Status New   PT LONG TERM GOAL #3   Title Patient will ben independent with home program without cuing for pain control, exercises for flexiblity and strength to be able to self manage symptoms to continue progressing towards prior level of funciton by 08/29/2015   Baseline limited knowledge of appropriate pain control strategies/exercise progression to achieve goals and return to prior level of funciton   Status New               Plan - 08/01/15 1127    Clinical Impression Statement Progressing well with current treatment with improved pain and decreased spasms with [place] and estim. today. Will continue with modalities to relieve spasms and pain and patient will exercise at home and progress as indicated.    Rehab Potential Good   PT Frequency 2x / week   PT Duration 6 weeks   PT Treatment/Interventions Manual techniques;Patient/family education;Electrical Stimulation;Therapeutic exercise;Moist Heat;Ultrasound   PT Next Visit Plan pain control, manual techniques for spasms, progressive ROM/strengthening exercises as indicated        Problem List Patient Active Problem List   Diagnosis Date Noted  . VULVAR VESTIBULITIS 07/30/2009    [name] PT 08/02/2015, 12:17 PM  [facility] [address] [place], [address] Phone: [phone]   Fax:  [phone]  Name: SORAYA SANTELLANO MRN: [id number] Date of Birth: 25-Jun-1941

## 2015-08-06 ENCOUNTER — Encounter: Payer: Self-pay | Admitting: Physical Therapy

## 2015-08-06 ENCOUNTER — Ambulatory Visit: Payer: Medicare Other | Admitting: Physical Therapy

## 2015-08-06 DIAGNOSIS — M62830 Muscle spasm of back: Secondary | ICD-10-CM

## 2015-08-06 DIAGNOSIS — M545 Low back pain, unspecified: Secondary | ICD-10-CM

## 2015-08-06 NOTE — Therapy (Signed)
[facility] [address], [place], [address] Phone: [phone]   Fax:  [phone]  Physical Therapy Treatment  Patient Details  Name: SHAKERAH CAGE MRN: [id number] Date of Birth: 11/17/1940 Referring Provider: Ned Card, MD  Encounter Date: 08/06/2015      PT End of Session - 08/06/15 1232    Visit Number 6   Number of Visits 12   Date for PT Re-Evaluation 08/29/15   Authorization Type 6   Authorization Time Period 10 G code   PT Start Time 1103   PT Stop Time 1157   PT Time Calculation (min) 54 min   Activity Tolerance Patient tolerated treatment well   Behavior During Therapy [facility] for tasks assessed/performed      Past Medical History  Diagnosis Date  . Hypertension   . Diabetes mellitus without complication ([place])   . Allergy     History reviewed. No pertinent past surgical history.  There were no vitals filed for this visit.  Visit Diagnosis:  Muscle spasm of back  Left-sided low back pain without sciatica      Subjective Assessment - 08/06/15 1107    Subjective Patient reports she has not had any burning, pain since last session with [place] and estim. and STM. She is returning to [place] at heart exercise class with modifications to prevent back pain/spasms ie no dancing.    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 Yes   Pain Score 1   both sides across lower back   Pain Location Back   Pain Orientation Lower   Pain Descriptors / Indicators Burning;Aching   Pain Type Acute pain  06/2015   Pain Onset More than a month ago   Pain Frequency Intermittent       Objective: Posture: decreased lumbar lordosis AROM: lumbar spine forward flexion decreased 50%, lateral flexion decreased to right with right side pain, minimal decrease to left with mild left sided pain, extension  decreased 50% Palpation: + spasms along left lumbar spine L1-4/5 region          [facility] Adult PT Treatment/Exercise - 08/06/15 1110               Modalities   Modalities Electrical Stimulation;Ultrasound   Electrical Stimulation   Electrical Stimulation Location bilateral lumbar spine muslces   Electrical Stimulation Parameters high volt estim. continuous setting for reduction of muscle spasms (4) electrodes applied bilaterally along paraspinal muscles lumbar spine with patient seated in massage chair   Electrical Stimulation Goals Pain  muscle spasm reduction   Ultrasound   Ultrasound Location left lower back lumbar paraspinal muscles L1-4/5    Ultrasound Parameters 3Mhz pulsed 20% @ 1.4w/cm2 x 10 min. with patient seated in massage chair   Ultrasound Goals Pain  muscle spasm   Manual Therapy   Manual Therapy Soft tissue mobilization   Manual therapy comments spasms mid to lower back left side   Soft tissue mobilization STM superficiasl techniques to relieve spasms and improve soft tissue elasticity performed by PT with patient seated in massage chair       Patient response to treatment: decreased spasm and improved soft tissue elasticity with minimal to no pain on palpation, able to stand and move without pain at end of session        PT Long Term Goals - 07/18/15  [place] #1   Title Patient will demonstrate improved function with daily tasks with mild to no back symptoms as indicated by Modified Oswestry score of 20% or less by 2/23//2017   Baseline MODI 36% (moderate self perceived disability)   Status New   PT LONG TERM GOAL #2   Title Patient will be able to return to exercise class without limitaitons by 2/23//2017   Baseline unable to attend class at this time due to pain/stiffness/spasms   Status New   PT LONG TERM GOAL #3   Title Patient will ben independent with home program without cuing for pain control, exercises for flexiblity and  strength to be able to self manage symptoms to continue progressing towards prior level of funciton by 08/29/2015   Baseline limited knowledge of appropriate pain control strategies/exercise progression to achieve goals and return to prior level of funciton   Status New               Plan - 08/06/15 1200    Clinical Impression Statement Patient is progressing well with noted decreased spasms left lower back by >50% since beginning physical therapay and progressing with independent home program and self management of symptoms. Progressing with all goals.    Pt will benefit from skilled therapeutic intervention in order to improve on the following deficits Decreased strength;Pain;Impaired flexibility;Hypomobility;Increased muscle spasms;Decreased range of motion   Rehab Potential Good   PT Frequency 2x / week   PT Duration 6 weeks   PT Treatment/Interventions Manual techniques;Patient/family education;Electrical Stimulation;Therapeutic exercise;Moist Heat;Ultrasound   PT Next Visit Plan pain control, manual techniques for spasms, progressive ROM/strengthening exercises as indicated        Problem List Patient Active Problem List   Diagnosis Date Noted  . VULVAR VESTIBULITIS 07/30/2009    [name] PT 08/06/2015, 12:36 PM  [facility] [address], [place], [address] Phone: [phone]   Fax:  [phone]  Name: ROSHEDA ROZENBERG MRN: [id number] Date of Birth: Nov 29, 1940

## 2015-08-08 ENCOUNTER — Encounter: Payer: Self-pay | Admitting: Physical Therapy

## 2015-08-08 ENCOUNTER — Ambulatory Visit: Payer: Medicare Other | Attending: Pediatrics | Admitting: Physical Therapy

## 2015-08-08 DIAGNOSIS — G8929 Other chronic pain: Secondary | ICD-10-CM | POA: Diagnosis present

## 2015-08-08 DIAGNOSIS — M545 Low back pain, unspecified: Secondary | ICD-10-CM

## 2015-08-08 DIAGNOSIS — M62830 Muscle spasm of back: Secondary | ICD-10-CM

## 2015-08-08 DIAGNOSIS — M6281 Muscle weakness (generalized): Secondary | ICD-10-CM | POA: Diagnosis present

## 2015-08-09 NOTE — Therapy (Signed)
[facility] [address] [place], [address] Phone: [phone]   Fax:  [phone]  Physical Therapy Treatment  Patient Details  Name: Whitney [name] MRN: [id number] Date of Birth: 11-11-1940 Referring Provider: Ned Card, MD  Encounter Date: 08/08/2015      PT End of Session - 08/08/15 1115    Visit Number 7   Number of Visits 12   Date for PT Re-Evaluation 08/29/15   Authorization Type 7   Authorization Time Period 10 G code   PT Start Time 1025   PT Stop Time 1110   PT Time Calculation (min) 45 min   Activity Tolerance Patient tolerated treatment well   Behavior During Therapy [facility] for tasks assessed/performed      Past Medical History  Diagnosis Date  . Hypertension   . Diabetes mellitus without complication ([place])   . Allergy     History reviewed. No pertinent past surgical history.  There were no vitals filed for this visit.  Visit Diagnosis:  Muscle spasm of back  Left-sided low back pain without sciatica      Subjective Assessment - 08/08/15 1030    Subjective Patient reports she did have a little burning, soreness in her back yesterday. Minor discomfort and burning in lower back more on right side today.   Limitations Lifting;House hold activities;Sitting   Patient Stated Goals Patient would like to get back to young at heart program and return to all activities without a problem   Currently in Pain? Yes   Pain Score 2    Pain Location Back   Pain Orientation Lower;Right   Pain Descriptors / Indicators Aching;Burning   Pain Type Acute pain  06/2015   Pain Onset More than a month ago   Pain Frequency Intermittent        Objective: Posture: decreased lumbar lordosis AROM: lumbar spine forward flexion decreased 50%, lateral flexion decreased to right with right side pain, minimal decrease to left with mild left sided pain, extension decreased 50% Palpation: + spasms  along left lumbar spine L1-4/5 region, mild tightness noted on right      [facility] Adult PT Treatment/Exercise - 08/08/15 1034    Exercises   Exercises Other Exercises   Other Exercises  patient performed exercises with guidance, verbal and tactile cues and demonstration of PT: Seated hip ER with stretch, supine lying hip ER with figure 4 stretch, bilateral stretch supine and single leg stretch for ER at hips, each performed with verbal and tactile cues 3 reps each, side lying clam x 10 reps each with assistance, verbal cues   Modalities   Modalities Electrical Stimulation;Ultrasound   Electrical Stimulation   Electrical Stimulation Location and parameters bilateral lumbar spine muscles high volt estim. continuous setting for reduction of muscle spasms (4) electrodes applied bilaterally along paraspinal muscles lumbar spine with patient seated in massage chair x 15 min.   Electrical Stimulation Goals Pain  muscle spasm reduction   Ultrasound   Ultrasound location, parameters andGoals left lower back lumbar paraspinal muscles L1-4/5 3Mhz pulsed 20% @ 1.4w/cm2 x 10 min. with patient seated in massage chair Pain  muscle spasm   Manual Therapy   Manual Therapy Soft tissue mobilization   Manual therapy comments spasms mid to lower back left side   Soft tissue mobilization STM superficial techniques to relieve spasms and improve soft tissue elasticity performed by PT with patient seated in massage chair  Patient response to treatment: decreased spasm and improved soft tissue elasticity with minimal to no pain on palpation, able to stand and move without pain at end of session, good demonstration of exercises with verbal cues and assistance of PT             PT Education - 08/08/15 1110    Education provided Yes   Education Details HEP for stretching hips into ER, clam in side lying   Person(s) Educated Patient   Methods Explanation;Demonstration;Verbal cues   Comprehension  Verbalized understanding;Returned demonstration;Verbal cues required             PT Long Term Goals - 07/18/15 1040    PT LONG TERM GOAL #1   Title Patient will demonstrate imrpoved function with daily tasks with mild to no back symptoms as indicated by Modified Oswestry score of 20% or less by 2/23//2017   Baseline MODI 36% (moderate self perceived disability)   Status New   PT LONG TERM GOAL #2   Title Patient will be able to return to exercise class without limitaitons by 2/23//2017   Baseline unable to attend class at this time due to pain/stiffness/spasms   Status New   PT LONG TERM GOAL #3   Title Patient will ben independent with home program without cuing for pain control, exercises for flexiblity and strength to be able to self manage symptoms to continue progressing towards prior level of funciton by 08/29/2015   Baseline limited knowledge of appropriate pain control strategies/exercise progression to achieve goals and return to prior level of funciton   Status New               Plan - 08/08/15 1114    Clinical Impression Statement Patient is progressing well with decreasing spasm in lower back, less burning sensation. She is progressing towards return to full exercise at young at heart class and is able to perform more functions at home with household tasks than when she began therapy.    Pt will benefit from skilled therapeutic intervention in order to improve on the following deficits Decreased strength;Pain;Impaired flexibility;Hypomobility;Increased muscle spasms;Decreased range of motion   Rehab Potential Good   PT Frequency 2x / week   PT Duration 6 weeks   PT Treatment/Interventions Manual techniques;Patient/family education;Electrical Stimulation;Therapeutic exercise;Moist Heat;Ultrasound   PT Next Visit Plan pain control, manual techniques for spasms, progressive ROM/strengthening exercises as indicated        Problem List Patient Active Problem List    Diagnosis Date Noted  . VULVAR VESTIBULITIS 07/30/2009    [name] PT 08/09/2015, 2:46 PM  [facility] [facility] [address] [place], [address] Phone: [phone]   Fax:  [phone]  Name: Whitney [name] MRN: [id number] Date of Birth: February 26, 1941

## 2015-08-12 ENCOUNTER — Encounter: Payer: Self-pay | Admitting: Physical Therapy

## 2015-08-12 ENCOUNTER — Ambulatory Visit: Payer: Medicare Other | Admitting: Physical Therapy

## 2015-08-12 DIAGNOSIS — M6281 Muscle weakness (generalized): Secondary | ICD-10-CM

## 2015-08-12 DIAGNOSIS — M62830 Muscle spasm of back: Secondary | ICD-10-CM | POA: Diagnosis not present

## 2015-08-12 DIAGNOSIS — M545 Low back pain, unspecified: Secondary | ICD-10-CM

## 2015-08-12 DIAGNOSIS — G8929 Other chronic pain: Secondary | ICD-10-CM

## 2015-08-12 NOTE — Therapy (Signed)
[facility] [address], [place], [address] Phone: [phone]   Fax:  [phone]  Physical Therapy Treatment  Patient Details  Name: Whitney [name] MRN: [id number] Date of Birth: 05/06/1941 Referring Provider: Ned Card, MD  Encounter Date: 08/12/2015      PT End of Session - 08/12/15 1506    Visit Number 8   Number of Visits 12   Date for PT Re-Evaluation 08/29/15   Authorization Type 8   Authorization Time Period 10 G code   PT Start Time [id number]   PT Stop Time 1615   PT Time Calculation (min) 80 min   Activity Tolerance Patient tolerated treatment well   Behavior During Therapy [facility] for tasks assessed/performed      Past Medical History  Diagnosis Date  . Hypertension   . Diabetes mellitus without complication ([place])   . Allergy     History reviewed. No pertinent past surgical history.  There were no vitals filed for this visit.  Visit Diagnosis:  Muscle spasm of back  Muscle weakness  Chronic bilateral low back pain without sciatica      Subjective Assessment - 08/12/15 1500    Subjective Good results following previous session. She is complaing of stiffness in lower back bilaterally today and this began yesterday. She reports she did exercises quite a bit and this may have aggravated lower back.     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 Yes   Pain Score 7    Pain Location Back   Pain Orientation Lower   Pain Descriptors / Indicators Tightness;Other (Comment)  stiffness   Pain Type Acute pain  06/2015   Pain Onset More than a month ago   Pain Frequency Intermittent        Objective: Posture: decreased lumbar lordosis AROM: lumbar spine forward flexion decreased 50%, lateral flexion decreased to right with right side pain, minimal decrease to left with mild left  sided pain, extension decreased 50% Palpation: + spasms along left lumbar spine L1-4/5 region, mild tightness noted on right        [facility] Adult PT Treatment/Exercise - 08/12/15 1505    Exercises   Exercises Other Exercises   Other Exercises  patient performed exercises with guidance, verbal and tactile cues and demonstration of PT: Supine lying ER with stretch 3 x 20 seconds right hip, ER x 3 left hip without stretch, side lying clam bilaterally with assistance x 10 reps each    Modalities   Modalities Electrical Stimulation   Electrical Stimulation   Electrical Stimulation Location and parameters bilateral lumbar spine muscles High volt: continuous setting for reduction of muscle spasms (2) electrodes applied along left paraspinal muscles lumbar spine and (2) electrodes applied to right lower back/gluteal/piriformis muscle with patient seated in massage chair x 15 min.   Electrical Stimulation Goals Pain  muscle spasm reduction          Manual Therapy   Manual Therapy Soft tissue mobilization   Manual therapy comments spasms mid to lower back left side and piriformis/gluteal muscles right   Soft tissue mobilization STM superficiasl techniques to relieve spasms and improve soft tissue elasticity left lumbar spine, right piriformis, gluteal muscle       Patient response to treatment: improved pain level to 4/10 following treatment, verbalized understanding of home exercise modification to  avoid aggravting lower back pain, performed all exercises with assistance and verbal cues          PT Education - 08/12/15 1540    Education provided Yes   Education Details modified home program to decrease intensity (green resistive band to yellow) split exercises each day   Person(s) Educated Patient   Methods Explanation;Demonstration;Verbal cues   Comprehension Verbalized understanding;Returned demonstration;Verbal cues required             PT Long Term Goals - 07/18/15 1040     PT LONG TERM GOAL #1   Title Patient will demonstrate imrpoved function with daily tasks with mild to no back symptoms as indicated by Modified Oswestry score of 20% or less by 2/23//2017   Baseline MODI 36% (moderate self perceived disability)   Status New   PT LONG TERM GOAL #2   Title Patient will be able to return to exercise class without limitaitons by 2/23//2017   Baseline unable to attend class at this time due to pain/stiffness/spasms   Status New   PT LONG TERM GOAL #3   Title Patient will ben independent with home program without cuing for pain control, exercises for flexiblity and strength to be able to self manage symptoms to continue progressing towards prior level of funciton by 08/29/2015   Baseline limited knowledge of appropriate pain control strategies/exercise progression to achieve goals and return to prior level of funciton   Status New               Plan - 08/12/15 1540    Clinical Impression Statement Patient pain level decreased to 0/10 at rest and mild following standing and walking following treatment. She is progressing towards all goals.    Pt will benefit from skilled therapeutic intervention in order to improve on the following deficits Decreased strength;Pain;Impaired flexibility;Hypomobility;Increased muscle spasms;Decreased range of motion   Rehab Potential Good   PT Frequency 2x / week   PT Duration 6 weeks   PT Treatment/Interventions Manual techniques;Patient/family education;Electrical Stimulation;Therapeutic exercise;Moist Heat;Ultrasound   PT Next Visit Plan pain control, manual techniques for spasms, progressive ROM/strengthening exercises as indicated   PT Home Exercise Plan modifiy intensity of exercises for ER hips        Problem List Patient Active Problem List   Diagnosis Date Noted  . VULVAR VESTIBULITIS 07/30/2009    [name] PT 08/12/2015, 10:52 PM  [facility] [address] [place], [address] Phone: [phone]   Fax:  [phone]  Name: Whitney [name] MRN: [id number] Date of Birth: Aug 21, 1940

## 2015-08-23 ENCOUNTER — Ambulatory Visit: Payer: Medicare Other | Admitting: Podiatry

## 2015-08-27 ENCOUNTER — Encounter: Payer: Medicare Other | Admitting: Physical Therapy

## 2015-09-02 ENCOUNTER — Telehealth: Payer: Self-pay | Admitting: *Deleted

## 2015-09-02 NOTE — Telephone Encounter (Signed)
Pt states she had to cancel 08/23/2015 appt due to back problem, and is not able to come in for follow up due to she is unable to drive or ride in a car at this time.  Pt states her plantar fasciitis is totally gone and she is wondering if she can foregoe a follow up appt at this time.  I informed pt that Dr. Paulla Dolly understood that her back was the problem now and he was happy that her feet were better, and to call us when she needed [place].  Pt agreed.

## 2015-09-09 NOTE — Telephone Encounter (Signed)
Sounds good

## 2015-09-24 ENCOUNTER — Ambulatory Visit: Payer: Medicare Other | Attending: Pediatrics | Admitting: Physical Therapy

## 2015-09-24 ENCOUNTER — Encounter: Payer: Self-pay | Admitting: Physical Therapy

## 2015-09-24 DIAGNOSIS — M62838 Other muscle spasm: Secondary | ICD-10-CM | POA: Insufficient documentation

## 2015-09-24 DIAGNOSIS — M6281 Muscle weakness (generalized): Secondary | ICD-10-CM

## 2015-09-24 DIAGNOSIS — M25511 Pain in right shoulder: Secondary | ICD-10-CM

## 2015-09-25 NOTE — Therapy (Signed)
[facility] [address], [place], [address] Phone: [phone]   Fax:  [phone]  Physical Therapy Evaluation  Patient Details  Name: Whitney [name] MRN: [id number] Date of Birth: [birth year] Referring Provider: Cindee Salt.,  MD  Encounter Date: 09/24/2015      PT End of Session - 09/24/15 1838    Visit Number 1   Number of Visits 8   Date for PT Re-Evaluation 10/22/15   Authorization Type 1   Authorization Time Period 10 G code   PT Start Time 1835   PT Stop Time 1930   PT Time Calculation (min) 55 min   Activity Tolerance Patient tolerated treatment well   Behavior During Therapy [facility] for tasks assessed/performed      Past Medical History  Diagnosis Date  . Hypertension   . Diabetes mellitus without complication ([place])   . Allergy     History reviewed. No pertinent past surgical history.  There were no vitals filed for this visit.  Visit Diagnosis:  Pain in right shoulder - Plan: PT plan of care cert/re-cert  Muscle weakness of right upper extremity - Plan: PT plan of care cert/re-cert  Muscle spasm of right shoulder - Plan: PT plan of care cert/re-cert      Subjective Assessment - 09/24/15 1843    Subjective Patient has complaints of aching in right shoulder that may radiated to elbow intermittently. She is limited in abiltiy to exercise with upper body, raising right arm overhead.   Pertinent History Patient reports she had right shoulder pain that began in early December 2016 for no apparent reason. Since then the pain has gotten worse and intermittently will radiate to elbow. The pain is described as an ache and has not changed much. She has been using aspercream  for relief with some results.    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 Yes   Pain Score 2   best 1/10, worst 3/10 in the past week   Pain Location Shoulder   Pain Orientation Right   Pain Descriptors / Indicators Aching   Pain Type Chronic pain  06/2015   Pain Onset More than a month ago   Pain Frequency Constant   Aggravating Factors  lifting, reaching up above shoulder level, reaching back out to right side   Pain Relieving Factors rest, aspercream, biofreeze   Effect of Pain on Daily Activities she does not lift anything heavy and she is careful not to raise arm above shoulder level            [facility] PT Assessment - 09/24/15 1840    Assessment   Medical Diagnosis Right anterior shoulder pain M25.511   Referring Provider Cindee Salt.,  MD   Onset Date/Surgical Date 06/06/15   Hand Dominance Right   Next MD Visit unknown   Prior Therapy none   Precautions   Precautions None   Restrictions   Weight Bearing Restrictions No   Balance Screen   Has the patient fallen in the past 6 months No   Has the patient had a decrease in activity level because of a fear of falling?  No   Is the patient reluctant to leave their home because of a fear of falling?  No   Home  [place] residence   Living Arrangements Spouse/significant other   [place] to enter   Entrance Stairs-Number of Steps 2   Entrance Stairs-Rails None   Home Layout Two level   Alternate Level Stairs-Number of Steps 10   Alternate Level Stairs-Rails Left  going up   [place] seat - built in   Prior Function   Level of Independence Independent   Vocation Retired   Leisure read, play cards, go out for lunch, exercise at [employer]   Overall Cognitive Status Within Functional Limits for tasks assessed      Objective: AROM: left UE WNL all joints and planes shoulder, elbow, forearm, wrist Right UE shoulder flexion to 120 with pain end range, ER and IR decreased functional ROM 50% with  pain Strength: LUE shoulder and periscapular muscles WNL's without pain, RUE + winging scapula, decreased ability to perform scapular adduction, painful shoulder flexion, abduction and ER Palpation: + spasms right upper trapezius and painful anterior aspect right shoulder with increased thickness of tendons Posture: right shoulder forward and higher than left, + winging right scapula  Special tests: + impingement with Michel Bickers, [place], crossing arm to opposite shoulder  Outcome measure: QuickDash 41% (moderate self  perceived disability: in need of intervention) 43.75% for work module: homemaking (moderate self perceived disability: in need of intervention)        PT Education - 09/24/15 1945    Education provided Yes   Education Details Educated in anatomy of shoulder; instructed in precautions and impingement postures to avoid; exercises: scapular adduction, posture awareness   Person(s) Educated Patient   Methods Explanation;Demonstration;Verbal cues;Other (comment)  model of shoulder   Comprehension Verbalized understanding;Returned demonstration;Verbal cues required;Tactile cues required             PT Long Term Goals - 09/24/15 1940    PT LONG TERM GOAL #1   Title Patient will demonstrate improved functional use with decreased pain right UE as indicated by QuickDash score of 20% or less by 10/22/2015   Baseline quickDash 41%; work module 43.75%   Status New   PT LONG TERM GOAL #2   Title Patient will be independent with precautions for impingement right shoulder by 10/04/2015   Baseline patient has no knowledge of precautions for impingement right shoulder   Status New   PT LONG TERM GOAL #3   Title Patient will be independent with home program without cuing for pain control, exercises for flexiblity and strength to be able to self manage symptoms to continue progressing towards prior level of funciton by 418/2017   Baseline limited knowledge of appropriate pain  control strategies/exercise progression to achieve goals and return to prior level of function   Status New               Plan - 09/25/15 1602    Clinical Impression Statement Patient is a 75 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 She has QuickDash score of 41% self perceived disability with daily functional tasks and 43.75% self perceived disability for homemaking activties. Her AROM right shoulder is limited to 120 degrees flexion, decreased functional ER/IR 50% with pain in right upper arm. She has no knowledge of appropriate pain control strategies, progression of exercises in order to return to full functional use right UE without limitations.  Pt will benefit from skilled therapeutic intervention in order to improve on the following deficits Decreased strength;Decreased knowledge of precautions;Pain;Impaired UE functional use;Increased muscle spasms;Decreased range of motion   Rehab Potential Good   Clinical Impairments Affecting Rehab Potential (+) active, motivated  (-) chronic back problems, hernia surgeries with limitations with lifting >5#, age   PT Frequency 2x / week   PT Duration 4 weeks   PT Treatment/Interventions Manual techniques;Patient/family education;Electrical Stimulation;Therapeutic exercise;Moist Heat;Ultrasound   PT Next Visit Plan pain control, manual techniques for spasms, progressive ROM/strengthening exercises as indicated   PT Home Exercise Plan observe precautions for impingement right shoulder, postural awareness, exercises for scapular control   Consulted and Agree with Plan of Care Patient          G-Codes - 10-12-2015 1940    Functional Assessment Tool Used QuickDash, pain scale, ROM and strength deficits, clinical judgment   Functional Limitation Carrying, moving and handling objects   Carrying, Moving and Handling  Objects Current Status [phone]) At least 40 percent but less than 60 percent impaired, limited or restricted   Carrying, Moving and Handling Objects Goal Status [id number]) At least 1 percent but less than 20 percent impaired, limited or restricted       Problem List Patient Active Problem List   Diagnosis Date Noted  . VULVAR VESTIBULITIS 07/30/2009    [name] PT 09/25/2015, 4:48 PM  [facility] [address], [place], [address] Phone: [phone]   Fax:  [phone]  Name: Whitney [name] MRN: [id number] Date of Birth: 1940/10/04

## 2015-09-27 ENCOUNTER — Encounter: Payer: Self-pay | Admitting: Physical Therapy

## 2015-09-27 ENCOUNTER — Ambulatory Visit: Payer: Medicare Other | Admitting: Physical Therapy

## 2015-09-27 DIAGNOSIS — M62838 Other muscle spasm: Secondary | ICD-10-CM

## 2015-09-27 DIAGNOSIS — M6281 Muscle weakness (generalized): Secondary | ICD-10-CM

## 2015-09-27 DIAGNOSIS — M25511 Pain in right shoulder: Secondary | ICD-10-CM

## 2015-09-27 NOTE — Therapy (Signed)
[facility] [address] [place], [address] Phone: [phone]   Fax:  [phone]  Physical Therapy Treatment  Patient Details  Name: Whitney [name] MRN: [id number] Date of Birth: 08-30-1940 Referring Provider: Cindee Salt.,  MD  Encounter Date: 09/27/2015      PT End of Session - 09/27/15 1113    Visit Number 2   Number of Visits 8   Date for PT Re-Evaluation 10/22/15   Authorization Type 2   Authorization Time Period 10 G code   PT Start Time 1100   PT Stop Time 1158   PT Time Calculation (min) 58 min   Activity Tolerance Patient tolerated treatment well   Behavior During Therapy [facility] for tasks assessed/performed      Past Medical History  Diagnosis Date  . Hypertension   . Diabetes mellitus without complication ([place])   . Allergy     History reviewed. No pertinent past surgical history.  There were no vitals filed for this visit.  Visit Diagnosis:  Pain in right shoulder  Muscle weakness of right upper extremity  Muscle spasm of right shoulder      Subjective Assessment - 09/27/15 1109    Subjective Patient reports she is feeling a lot better and more aware of her precautions and is working obeying these more consistently.    Limitations Lifting;House hold activities;Sitting   Patient Stated Goals Patient would like to get back to young at heart program and return to all activities without a problem without right arm pain   Currently in Pain? No/denies      Objective: Palpation: right shoulder with increased thickness in anterior aspect/tendons AROM: Pre treatment: right shoulder forward elevation to 130 with mild pain Posture: sitting with right arm at side crossed in front of trunk  Treatment:  Manual therapy: STM cervical spine with patient supine lying: decrease muscle spasms right side, improve soft tissue elasticity, upper trapezius release right with patient side  lying left 3 x 20 seconds, scapular mobilization; rotation/depression   Ultrasound: pulsed 20%, 3MHZ @ 1.4w/cm2 applied to anterior right shoulder x 8 min. With patient seated, right UE supported on pillow  Exercise:  patient performed exercises with guidance, verbal and tactile cues and demonstration of PT: Side lying: shoulder elevation/depression with cuing 2 x 10, protraction/retraction 2 x 10, ER with towel under arm 2 x 10  Moist heat applied to right shoulder following session x 10 min. To decrease soreness, patient seated in chair: no adverse reactions noted    Patient response to treatment:  Improved ability to raise right UE to above shoulder level without pain, able to understand precautions for shoulder impingement and verbalize understanding, improved soft tissue elasticity without tenderness following [place] and improved control and proper technique with exercises demonstrated following instruction and with cuing         PT Education - 09/27/15 1114    Education provided Yes   Education Details Reviewed precautions for impingement right shoulder verbally and demonstration   Person(s) Educated Patient   Methods Explanation;Demonstration;Verbal cues   Comprehension Verbalized understanding;Returned demonstration;Verbal cues required             PT Long Term Goals - 09/24/15 1940    PT LONG TERM GOAL #1   Title Patient will demonstrate improved functional use with decreased pain right UE as indicated by QuickDash score of 20% or less by 10/22/2015   Baseline quickDash 41%; work module 43.75%  Status New   PT LONG TERM GOAL #2   Title Patient will be independent with precautions for impingement right shoulder by 10/04/2015   Baseline patient has no knowledge of precautions for impingement right shoulder   Status New   PT LONG TERM GOAL #3   Title Patient will be independent with home program without cuing for pain control, exercises for flexiblity and strength to be  able to self manage symptoms to continue progressing towards prior level of funciton by 418/2017   Baseline limited knowledge of appropriate pain control strategies/exercise progression to achieve goals and return to prior level of function   Status New               Plan - 09/27/15 1154    Clinical Impression Statement Patient tolerated session well with decreased tenderness anterior aspect right shoulder with STM and [place]. She is progressing with motor control exercises and should continue to improve funciton with dailiy tasks as she improves strength.    Pt will benefit from skilled therapeutic intervention in order to improve on the following deficits Decreased strength;Decreased knowledge of precautions;Pain;Impaired UE functional use;Increased muscle spasms;Decreased range of motion   Rehab Potential Good   PT Frequency 2x / week   PT Duration 4 weeks   PT Treatment/Interventions Manual techniques;Patient/family education;Electrical Stimulation;Therapeutic exercise;Moist Heat;Ultrasound   PT Next Visit Plan pain control, manual techniques for spasms, progressive ROM/strengthening exercises as indicated   PT Home Exercise Plan observe precautions for impingement right shoulder, postural awareness, exercises for scapular control        Problem List Patient Active Problem List   Diagnosis Date Noted  . VULVAR VESTIBULITIS 07/30/2009    [name] PT 09/27/2015, 12:08 PM  [facility] [address], [place], [address] Phone: [phone]   Fax:  [phone]  Name: Whitney [name] MRN: [id number] Date of Birth: [birth year]

## 2015-10-02 ENCOUNTER — Ambulatory Visit: Payer: Medicare Other | Admitting: Physical Therapy

## 2015-10-02 ENCOUNTER — Encounter: Payer: Self-pay | Admitting: Physical Therapy

## 2015-10-02 DIAGNOSIS — M6281 Muscle weakness (generalized): Secondary | ICD-10-CM

## 2015-10-02 DIAGNOSIS — M25511 Pain in right shoulder: Secondary | ICD-10-CM | POA: Diagnosis not present

## 2015-10-02 DIAGNOSIS — M62838 Other muscle spasm: Secondary | ICD-10-CM

## 2015-10-02 NOTE — Therapy (Signed)
[facility] [address] [place], [address] Phone: [phone]   Fax:  [phone]  Physical Therapy Treatment  Patient Details  Name: Whitney [name] MRN: [id number] Date of Birth: [birth year] Referring Provider: Cindee Salt.,  MD  Encounter Date: 10/02/2015      PT End of Session - 10/02/15 1146    Visit Number 3   Number of Visits 8   Date for PT Re-Evaluation 10/22/15   Authorization Type 3   Authorization Time Period 10 G code   PT Start Time 1048   PT Stop Time 1136   PT Time Calculation (min) 48 min   Activity Tolerance Patient tolerated treatment well   Behavior During Therapy [facility] for tasks assessed/performed      Past Medical History  Diagnosis Date  . Hypertension   . Diabetes mellitus without complication ([place])   . Allergy     History reviewed. No pertinent past surgical history.  There were no vitals filed for this visit.  Visit Diagnosis:  Pain in right shoulder  Muscle weakness of right upper extremity  Muscle spasm of right shoulder      Subjective Assessment - 10/02/15 1143    Subjective Patient reports she is more aware of precautions mentioning she is not crossing her arm over midline.    Limitations Lifting;House hold activities;Sitting   Patient Stated Goals Patient would like to get back to young at heart program and return to all activities without a problem without right arm pain   Currently in Pain? No/denies       Objective: Palpation: right shoulder with increased thickness in anterior aspect/tendons, increased muscle guarding and spasms along infraspinatus and upper trap  AROM: Pre treatment: right shoulder forward elevation to 130 with mild pain Posture: sitting with right arm resting on arm of the chair.  Treatment:  Manual therapy: STM cervical spine with patient in sitting : decrease muscle spasms right side, improve soft tissue elasticity,    Ultrasound: Continuous, 3MHZ @ 1.4w/cm2 applied to upper trapezius spasm x 5 min. With patient seated, right UE supported on pillow  Exercise:  patient performed exercises with guidance, verbal and tactile cues and demonstration of PT: Side lying: shoulder elevation/depression with cuing 2 x 10, protraction/retraction 2 x 10, ER with towel under arm x 10, x10 with 1# weight Scapular Retraction with yellow tubing in standing-- x10 Straight arm shoulder ext with yellow tubing in standing -- x15 (Performed through shortened range)   Patient response to treatment:  Right shoulder instability noted when performing straight arm shoulder ext with yellow tubing and required modification of exercise through decreased ROM to correct. Increased shoulder protraction when performing scapular elevation and depression and required tactile cueing to improve movement.        PT Education - 10/02/15 1145    Education provided Yes   Education Details HEP: scapular retraction with yellow tubing; Shoulder ER in sidelying   Person(s) Educated Patient   Methods Explanation;Demonstration;Tactile cues   Comprehension Verbalized understanding;Returned demonstration;Verbal cues required             PT Long Term Goals - 10/02/15 1147    PT LONG TERM GOAL #1   Title Patient will demonstrate improved functional use with decreased pain right UE as indicated by QuickDash score of 20% or less by 10/22/2015   Baseline quickDash 41%; work module 43.75%   Status New   PT LONG TERM GOAL #2  Title Patient will be independent with precautions for impingement right shoulder by 10/04/2015   Baseline patient has no knowledge of precautions for impingement right shoulder (10/02/2015: patient independent with knowledge of shoulder impingement precautions)   Status Achieved   PT LONG TERM GOAL #3   Title Patient will be independent with home program without cuing for pain control, exercises for flexiblity and  strength to be able to self manage symptoms to continue progressing towards prior level of funciton by 10/22/2015   Baseline limited knowledge of appropriate pain control strategies/exercise progression to achieve goals and return to prior level of function   Status New               Plan - 10/02/15 1207    Clinical Impression Statement Patient demonstrated functional carryover between visits  With improved knowledge and application of shoulder  impingement precautions. Pt continues to demonstrate increased anterior scapular tilt in the resting position and during exercise and will benefit from further skilled therapy to improve motor control and endurance.    Pt will benefit from skilled therapeutic intervention in order to improve on the following deficits Decreased strength;Decreased knowledge of precautions;Pain;Impaired UE functional use;Increased muscle spasms;Decreased range of motion   Rehab Potential Good   PT Frequency 2x / week   PT Duration 4 weeks   PT Treatment/Interventions Manual techniques;Patient/family education;Electrical Stimulation;Therapeutic exercise;Moist Heat;Ultrasound   PT Next Visit Plan pain control, manual techniques for spasms, progressive ROM/strengthening exercises as indicated   PT Home Exercise Plan observe precautions for impingement right shoulder, postural awareness, exercises for scapular control        Problem List Patient Active Problem List   Diagnosis Date Noted  . VULVAR VESTIBULITIS 07/30/2009    Blythe Stanford, SPT 10/02/2015, 12:13 PM  [facility] [address] [place], [address] Phone: [phone]   Fax:  [phone]  Name: Whitney [name] MRN: [id number] Date of Birth: December 22, 1940

## 2015-10-04 ENCOUNTER — Encounter: Payer: Medicare Other | Admitting: Physical Therapy

## 2015-10-08 DIAGNOSIS — K7581 Nonalcoholic steatohepatitis (NASH): Secondary | ICD-10-CM | POA: Insufficient documentation

## 2015-10-08 DIAGNOSIS — K7460 Unspecified cirrhosis of liver: Secondary | ICD-10-CM | POA: Insufficient documentation

## 2015-10-09 ENCOUNTER — Encounter: Payer: Medicare Other | Admitting: Physical Therapy

## 2015-10-11 ENCOUNTER — Encounter: Payer: Self-pay | Admitting: Physical Therapy

## 2015-10-11 ENCOUNTER — Ambulatory Visit: Payer: Medicare Other | Attending: Pediatrics | Admitting: Physical Therapy

## 2015-10-11 DIAGNOSIS — M25511 Pain in right shoulder: Secondary | ICD-10-CM

## 2015-10-11 DIAGNOSIS — M6281 Muscle weakness (generalized): Secondary | ICD-10-CM

## 2015-10-11 NOTE — Therapy (Signed)
[facility] [address] [place], [address] Phone: [phone]   Fax:  [phone]  Physical Therapy Treatment  Patient Details  Name: Whitney [name] MRN: [id number] Date of Birth: [birth year] Referring Provider: Cindee [name].,  MD  Encounter Date: 10/11/2015      PT End of Session - 10/11/15 1046    Visit Number 4   Number of Visits 8   Date for PT Re-Evaluation 10/22/15   Authorization Type 4   Authorization Time Period 10 G code   PT Start Time 1014   PT Stop Time 1055   PT Time Calculation (min) 41 min   Activity Tolerance Patient tolerated treatment well   Behavior During Therapy [facility] for tasks assessed/performed      Past Medical History  Diagnosis Date  . Hypertension   . Diabetes mellitus without complication ([place])   . Allergy     History reviewed. No pertinent past surgical history.  There were no vitals filed for this visit.      Subjective Assessment - 10/11/15 1017    Subjective increased pain in back with band exercise last session. In spasms and increased pain in right upper arm today and requests only soft tissue work today.   Limitations Lifting;House hold activities;Sitting   Patient Stated Goals Patient would like to get back to young at heart program and return to all activities without a problem without right arm pain   Currently in Pain? Yes   Pain Score 3    Pain Location Shoulder   Pain Orientation Right   Pain Descriptors / Indicators Aching   Pain Type Chronic pain  06/2015   Pain Onset More than a month ago        Objective: Palpation: right shoulder with increased thickness in anterior aspect/tendons, increased muscle guarding and spasms along right shoulder, upper trapezius, thoracic/upper lumbar paraspinal muscles  AROM: Pre treatment: right shoulder forward elevation to 130 with mild pain Posture: rounded shoulders, right scapula  winging   Treatment:  Manual therapy: STM cervical spine, thoracic spine and right shoulder with patient in sitting (massage chair) : decrease muscle spasms right side, improve soft tissue elasticity,  moist heat pack applied to back and shoulders following STM with patient seated in back supported x 15 min. : decreased spasms/pain; no adverse reactions noted   Patient response to treatment:  Improved soft tissue elasticity and decreased winging of right scapula at end of session. Patient verbalized good understanding of home program and reported decreased pain to 1/10         PT Education - 10/11/15 1045    Education provided Yes   Education Details modified HEP with discontinue scapular row with resistive band   Person(s) Educated Patient   Methods Explanation   Comprehension Verbalized understanding             PT Long Term Goals - 10/02/15 1147    PT LONG TERM GOAL #1   Title Patient will demonstrate improved functional use with decreased pain right UE as indicated by QuickDash score of 20% or less by 10/22/2015   Baseline quickDash 41%; work module 43.75%   Status New   PT LONG TERM GOAL #2   Title Patient will be independent with precautions for impingement right shoulder by 10/04/2015   Baseline patient has no knowledge of precautions for impingement right shoulder (10/02/2015: patient independent with knowledge of shoulder impingement precautions)   Status Achieved  PT LONG TERM GOAL #3   Title Patient will be independent with home program without cuing for pain control, exercises for flexiblity and strength to be able to self manage symptoms to continue progressing towards prior level of funciton by 10/22/2015   Baseline limited knowledge of appropriate pain control strategies/exercise progression to achieve goals and return to prior level of function   Status New               Plan - 10/11/15 1046    Clinical Impression Statement patient responded well to  treatment of soft tissue mobilization and re assessment of home program. Pain and spasms reduced by 50% or more with patient verbalizing good understanding of home program.    Rehab Potential Good   PT Frequency 2x / week   PT Duration 4 weeks   PT Treatment/Interventions Manual techniques;Patient/family education;Electrical Stimulation;Therapeutic exercise;Moist Heat;Ultrasound   PT Next Visit Plan pain control, manual techniques for spasms, progressive ROM/strengthening exercises as indicated      Patient will benefit from skilled therapeutic intervention in order to improve the following deficits and impairments:  Decreased strength, Decreased knowledge of precautions, Pain, Impaired UE functional use, Increased muscle spasms, Decreased range of motion  Visit Diagnosis: Pain in right shoulder  Muscle weakness (generalized)     Problem List Patient Active Problem List   Diagnosis Date Noted  . VULVAR VESTIBULITIS 07/30/2009    [name] PT 10/11/2015, 2:54 PM  [facility] [address], [place], [address] Phone: [phone]   Fax:  [phone]  Name: Whitney [name] MRN: [id number] Date of Birth: 10/16/1940

## 2015-10-16 ENCOUNTER — Encounter: Payer: Self-pay | Admitting: Physical Therapy

## 2015-10-16 ENCOUNTER — Ambulatory Visit: Payer: Medicare Other | Admitting: Physical Therapy

## 2015-10-16 DIAGNOSIS — M25511 Pain in right shoulder: Secondary | ICD-10-CM | POA: Diagnosis not present

## 2015-10-16 DIAGNOSIS — M6281 Muscle weakness (generalized): Secondary | ICD-10-CM

## 2015-10-16 NOTE — Therapy (Signed)
[facility] [address], [place], [address] Phone: [phone]   Fax:  [phone]  Physical Therapy Treatment  Patient Details  Name: Whitney [name] MRN: [id number] Date of Birth: 1940-10-07 Referring Provider: Cindee Salt.,  MD  Encounter Date: 10/16/2015      PT End of Session - 10/16/15 1011    Visit Number 5   Number of Visits 8   Date for PT Re-Evaluation 10/22/15   Authorization Type 5   Authorization Time Period 10 G code   PT Start Time 1005   PT Stop Time 1050   PT Time Calculation (min) 45 min   Activity Tolerance Patient tolerated treatment well   Behavior During Therapy [facility] for tasks assessed/performed      Past Medical History  Diagnosis Date  . Hypertension   . Diabetes mellitus without complication ([place])   . Allergy     History reviewed. No pertinent past surgical history.  There were no vitals filed for this visit.      Subjective Assessment - 10/16/15 1006    Subjective Patient reports she had (2) days of no pain in back or right shoulder following previous session and reports she already has done her exerciess for back and shoulder today.    Limitations Lifting;House hold activities;Sitting   Patient Stated Goals Patient would like to get back to young at heart program and return to all activities without a problem without right arm pain   Currently in Pain? Other (Comment)  no pain in shoulder today unless she is moving it   Pain Location Shoulder   Pain Orientation Right   Pain Descriptors / Indicators Aching   Pain Type Chronic pain  06/2015   Pain Onset More than a month ago   Pain Frequency Intermittent        Objective: Palpation: right shoulder with increased muscle guarding and spasms along right shoulder, upper trapezius, thoracic/upper lumbar paraspinal muscles  AROM: Pre treatment: right shoulder forward elevation to 130 with mild pain Posture:  rounded shoulders, right scapula winging (decreased as compared to previous session)   Treatment:  Manual therapy: STM cervical spine, thoracic spine and right shoulder with patient supine on treatment table and in sitting (massage chair) : decrease muscle spasms right side, improve soft tissue elasticity,   Exercise: patient performed exercises with guidance, verbal and tactile cues and demonstration of PT: Supine lying: cervical spine retraction supine lying with isometric cervical spine extension 3 reps each with tactile cues  Scapular retraction x 10 reps with tactile cues, mild resistance given    Patient response to treatment:  Improved soft tissue elasticity and decreased winging of right scapula at end of session. Patient verbalized good understanding of home program and reported decreased pain to 0/10 in right shoulder; demonstrated good technique  With exercises and verbalized understanding of home program following review         PT Education - 10/16/15 1734    Education provided Yes   Education Details added supine scapular retraction, cervical spine retraction and isometric extension   Person(s) Educated Patient   Methods Explanation;Demonstration;Handout;Verbal cues   Comprehension Verbalized understanding;Returned demonstration;Verbal cues required;Tactile cues required             PT Long Term Goals - 10/16/15 1751    PT LONG TERM GOAL #1   Title Patient will demonstrate improved functional use with decreased pain right UE as indicated by QuickDash score  of 20% or less by 10/22/2015   Baseline quickDash 41%; work module 43.75%   Status On-going   PT LONG TERM GOAL #2   Title Patient will be independent with precautions for impingement right shoulder by 10/04/2015   Baseline patient has no knowledge of precautions for impingement right shoulder (10/02/2015: patient independent with knowledge of shoulder impingement precautions)   Status On-going   PT LONG TERM  GOAL #3   Title Patient will be independent with home program without cuing for pain control, exercises for flexiblity and strength to be able to self manage symptoms to continue progressing towards prior level of funciton by 10/22/2015   Baseline limited knowledge of appropriate pain control strategies/exercise progression to achieve goals and return to prior level of function   Status On-going               Plan - 10/16/15 1744    Clinical Impression Statement Patient is progressing well with improving posture and scapular motor control which is allowing her to perform daily tasks for personal care and household activities with less difficulty and shoulder pain. She continues with weakness in periscapular muscles and increased spasms in upper trapezius right side and will therefore benefit from additional physical therapy intervention.    Rehab Potential Good   PT Frequency 2x / week   PT Duration 4 weeks   PT Treatment/Interventions Manual techniques;Patient/family education;Electrical Stimulation;Therapeutic exercise;Moist Heat;Ultrasound   PT Next Visit Plan pain control, manual techniques for spasms, progressive ROM/strengthening exercises as indicated   PT Home Exercise Plan added supine exercises including cervical retraciton/extension   Consulted and Agree with Plan of Care Patient      Patient will benefit from skilled therapeutic intervention in order to improve the following deficits and impairments:  Decreased strength, Decreased knowledge of precautions, Pain, Impaired UE functional use, Increased muscle spasms, Decreased range of motion  Visit Diagnosis: Pain in right shoulder - Plan: PT plan of care cert/re-cert  Muscle weakness (generalized) - Plan: PT plan of care cert/re-cert     Problem List Patient Active Problem List   Diagnosis Date Noted  . VULVAR VESTIBULITIS 07/30/2009    [name] PT 10/16/2015, 6:04 PM  [facility] [address] [place], [address] Phone: [phone]   Fax:  [phone]  Name: Whitney [name] MRN: [id number] Date of Birth: 1941/05/23

## 2015-10-18 ENCOUNTER — Ambulatory Visit: Payer: Medicare Other | Admitting: Physical Therapy

## 2015-10-18 ENCOUNTER — Encounter: Payer: Self-pay | Admitting: Physical Therapy

## 2015-10-18 DIAGNOSIS — M6281 Muscle weakness (generalized): Secondary | ICD-10-CM

## 2015-10-18 DIAGNOSIS — M25511 Pain in right shoulder: Secondary | ICD-10-CM | POA: Diagnosis not present

## 2015-10-18 NOTE — Therapy (Signed)
[facility] [address] [place], [address] Phone: [phone]   Fax:  [phone]  Physical Therapy Treatment  Patient Details  Name: Whitney [name] MRN: [id number] Date of Birth: Apr 10, 1941 Referring Provider: Cindee Salt.,  MD  Encounter Date: 10/18/2015      PT End of Session - 10/18/15 1454    Visit Number 6   Number of Visits 8   Date for PT Re-Evaluation 10/22/15   Authorization Type 6   Authorization Time Period 10 G code   PT Start Time 0955   PT Stop Time 1040   PT Time Calculation (min) 45 min   Activity Tolerance Patient tolerated treatment well   Behavior During Therapy [facility] for tasks assessed/performed      Past Medical History  Diagnosis Date  . Hypertension   . Diabetes mellitus without complication ([place])   . Allergy     History reviewed. No pertinent past surgical history.  There were no vitals filed for this visit.      Subjective Assessment - 10/18/15 0956    Subjective Patient reports she has been exercising as instructed since last session and has been cleaning house, dusting blinds, cleaning countertops. She has not been following her precautions as well due to her cleaning.    Limitations Lifting;House hold activities;Sitting   Patient Stated Goals Patient would like to get back to young at heart program and return to all activities without a problem without right arm pain   Currently in Pain? Yes   Pain Score 3    Pain Location Shoulder   Pain Orientation Right   Pain Descriptors / Indicators Aching   Pain Type Chronic pain  06/2015   Pain Onset More than a month ago      Objective: Palpation: right shoulder with increased muscle guarding and spasms along right shoulder, upper trapezius  AROM: Pre treatment: right shoulder forward elevation to 130 with mild pain Posture: rounded shoulders, right scapula winging (decreased as compared to previous  session)   Treatment:  Manual therapy: STM cervical spine, and right shoulder with patient seated in chair with back supported: decrease muscle spasms right side, improve soft tissue elasticity; followed by exercise  Exercise: patient performed exercises with guidance, verbal and tactile cues and demonstration of PT: Supine lying: cervical spine retraction supine lying with isometric cervical spine extension 3 reps each with tactile cues  Scapular retraction x 10 reps with tactile cues, mild resistance given Side lying: scapular control for elevation/depression and retraction with tactile cues x 10 reps each Side lying shoulder ER with resistance given at end range and eccentric control through controlled ROM x 5 reps Supine lying holding 3# weight for shot arc forward elevation through pain free and no popping in shoulder (required scapular control/retracitn to perform correctly)  Modalities: Moist heat applied to right shoulder x 10 min. Following exercises for pain: patient seated in chair with right UE supported on pillow  Patient response to treatment:  Improved soft tissue elasticity and decreased winging of right scapula at end of session. Patient verbalized good understanding of home program and reported decreased pain to 0-1/10 in right shoulder; demonstrated good technique with exercises and verbalized understanding of home program following demonstration and with verbal cues          PT Education - 10/18/15 1453    Education provided Yes   Education Details HEP to continue with AAROM supine with 2# weight  through Partial ROM, pain/pop free, add ER in side lying and scapular retraction in side lying   Person(s) Educated Patient   Methods Explanation;Demonstration;Verbal cues   Comprehension Verbalized understanding;Returned demonstration;Verbal cues required;Tactile cues required             PT Long Term Goals - 10/16/15 1751    PT LONG TERM GOAL #1   Title  Patient will demonstrate improved functional use with decreased pain right UE as indicated by QuickDash score of 20% or less by 10/22/2015   Baseline quickDash 41%; work module 43.75%   Status On-going   PT LONG TERM GOAL #2   Title Patient will be independent with precautions for impingement right shoulder by 10/04/2015   Baseline patient has no knowledge of precautions for impingement right shoulder (10/02/2015: patient independent with knowledge of shoulder impingement precautions)   Status On-going   PT LONG TERM GOAL #3   Title Patient will be independent with home program without cuing for pain control, exercises for flexiblity and strength to be able to self manage symptoms to continue progressing towards prior level of funciton by 10/22/2015   Baseline limited knowledge of appropriate pain control strategies/exercise progression to achieve goals and return to prior level of function   Status On-going               Plan - 10/18/15 1456    Clinical Impression Statement Patient is progressing well with improvement noted in shoulder AROM in sitting, decreased winging of right scapula indicates improved strength and motor control. She continues with limited ROM and strength above shoulder level and decreased endurance for household chores and will therefore benefit from additional physical therapy intervention to achieve goals.    Rehab Potential Good   PT Frequency 2x / week   PT Duration 4 weeks   PT Treatment/Interventions Manual techniques;Patient/family education;Electrical Stimulation;Therapeutic exercise;Moist Heat;Ultrasound   PT Next Visit Plan pain control, manual techniques for spasms, progressive ROM/strengthening exercises as indicated   PT Home Exercise Plan added side lying exercises for ER, scapular control      Patient will benefit from skilled therapeutic intervention in order to improve the following deficits and impairments:  Decreased strength, Decreased knowledge  of precautions, Pain, Impaired UE functional use, Increased muscle spasms, Decreased range of motion  Visit Diagnosis: Pain in right shoulder  Muscle weakness (generalized)     Problem List Patient Active Problem List   Diagnosis Date Noted  . VULVAR VESTIBULITIS 07/30/2009    [name] PT 10/18/2015, 2:59 PM  [facility] [address] [place], [address] Phone: [phone]   Fax:  [phone]  Name: Whitney [name] MRN: [id number] Date of Birth: [birth year]

## 2015-10-21 ENCOUNTER — Ambulatory Visit: Payer: Medicare Other | Admitting: Physical Therapy

## 2015-10-21 ENCOUNTER — Telehealth: Payer: Self-pay | Admitting: *Deleted

## 2015-10-21 ENCOUNTER — Encounter: Payer: Self-pay | Admitting: Physical Therapy

## 2015-10-21 DIAGNOSIS — M25511 Pain in right shoulder: Secondary | ICD-10-CM | POA: Diagnosis not present

## 2015-10-21 DIAGNOSIS — M6281 Muscle weakness (generalized): Secondary | ICD-10-CM

## 2015-10-21 NOTE — Therapy (Signed)
[facility] [address] [place], [address] Phone: [phone]   Fax:  [phone]  Physical Therapy Treatment  Patient Details  Name: Whitney [name] MRN: [id number] Date of Birth: December 20, 1940 Referring Provider: Cindee Salt.,  MD  Encounter Date: 10/21/2015      PT End of Session - 10/21/15 1033    Visit Number 7   Number of Visits 8   Date for PT Re-Evaluation 11/18/15   Authorization Type 7   Authorization Time Period 10 G code   PT Start Time [phone]   PT Stop Time 1050   PT Time Calculation (min) 58 min   Activity Tolerance Patient tolerated treatment well   Behavior During Therapy [facility] for tasks assessed/performed      Past Medical History  Diagnosis Date  . Hypertension   . Diabetes mellitus without complication ([place])   . Allergy     History reviewed. No pertinent past surgical history.  There were no vitals filed for this visit.      Subjective Assessment - 10/21/15 1028    Subjective Patient reports she is following her precautions with more consistancy than last week and is noticing improvement with less pain in her right shoulder. She conitnues with weakness in her right shoulder that limits full pain free funciton with all tasks and overall feels she is about 60% imrpoved since beginning physical therapy.  [facility]    Limitations Lifting;House hold activities;Sitting   Patient Stated Goals Patient would like to get back to young at heart program and return to all activities without a problem without right arm pain   Currently in Pain? Yes   Pain Score 2    Pain Location Shoulder   Pain Orientation Right   Pain Descriptors / Indicators Aching   Pain Type Chronic pain  06/2015   Pain Onset More than a month ago   Pain Frequency Intermittent        Objective: Palpation: right shoulder with increased muscle guarding and spasms  along right shoulder, upper trapezius  AROM: Pre treatment: right shoulder forward elevation to 130 with mild pain Posture: rounded shoulders, right scapula winging (decreased as compared to previous session)   Treatment:  Manual therapy: STM cervical spine, and right shoulder with patient seated in chair with back supported: decrease muscle spasms right side, improve soft tissue elasticity; followed by exercise Joint mobilization: scapulo thoracic lateral glide and rotation with patient in side lying  Exercise: patient performed exercises with guidance, verbal and tactile cues and demonstration of PT: Supine lying: cervical spine retraction supine lying with isometric cervical spine extension 5 reps each with tactile cues  Scapular retraction 2 x 10 reps with tactile cues, mild resistance given with 2 second holds Side lying: scapular control for elevation/depression and retraction with tactile cues x 10 reps each Side lying shoulder ER with resistance given at end range and eccentric control through controlled ROM 2 x 5 reps Supine lying holding 2# weight for chest press 2 x 10 reps  Supine lying: shoulder protraction with 1# weight held in hand with tactile cues x 10 reps  Modalities: Moist heat applied to right shoulder x 10 min. Following exercises for pain: patient seated in chair with right UE supported on pillow: no adverse reactions noted following treatment  Patient response to treatment:  Improved soft tissue elasticity and decreased winging of right scapula at  end of session. Patient verbalized good understanding of home program and reported decreased pain to 0-1/10 in right shoulder; demonstrated good technique with exercises and verbalized understanding of home program following demonstration and with verbal cues           PT Education - 10/21/15 1032    Education provided Yes   Education Details HEP add 2# chest press and continue with scapular adduction exercises  supine/side lying and sitting   Person(s) Educated Patient   Methods Explanation;Demonstration;Verbal cues   Comprehension Returned demonstration;Verbalized understanding;Verbal cues required             PT Long Term Goals - 10/21/15 1101    PT LONG TERM GOAL #1   Title Patient will demonstrate improved functional use with decreased pain right UE as indicated by QuickDash score of 20% or less by 11/18/2015   Baseline quickDash 41%; work module 43.75% (curent 10/21/15: 34%)   Status Partially Met/continue with goal   PT LONG TERM GOAL #2   Title Patient will be independent with precautions for impingement right shoulder by 10/04/2015   Baseline patient has no knowledge of precautions for impingement right shoulder (10/02/2015: patient independent with knowledge of shoulder impingement precautions)   Status Achieved   PT LONG TERM GOAL #3   Title Patient will be independent with home program without cuing for pain control, exercises for flexiblity and strength to be able to self manage symptoms to continue progressing towards prior level of funciton by 11/18/2015   Status Revised               Plan - 10/21/15 1059    Clinical Impression Statement Patient demonstrates improving scapular motor control with all exercises indicating good carry over from session to session. She continues with weakness in right shoulder musculature and decreased endurance which is limiting full pain free funciton for daily tasks. Her Quick Dash score has improved significantly from initial evaluation however remains >20% impaired indicating the need for additional physical therapy intervention to achieve goals.    Rehab Potential Good   Clinical Impairments Affecting Rehab Potential (+) active, motivated  (-) chronic back problems, hernia surgeries with limitations with lifting >5#, age   PT Frequency 2x / week   PT Duration 4 weeks   PT Treatment/Interventions Manual techniques;Patient/family  education;Electrical Stimulation;Therapeutic exercise;Moist Heat;Ultrasound   PT Next Visit Plan pain control, manual techniques for spasms, progressive ROM/strengthening exercises as indicated   PT Home Exercise Plan scapular control, shoulder ROM/strengthening exercises as instructed   Consulted and Agree with Plan of Care Patient      Patient will benefit from skilled therapeutic intervention in order to improve the following deficits and impairments:  Decreased strength, Decreased knowledge of precautions, Pain, Impaired UE functional use, Increased muscle spasms, Decreased range of motion  Visit Diagnosis: Pain in right shoulder  Muscle weakness (generalized)     Problem List Patient Active Problem List   Diagnosis Date Noted  . VULVAR VESTIBULITIS 07/30/2009    [name] PT 10/21/2015, 11:27 AM  [facility] [address] [place], [address] Phone: [phone]   Fax:  [phone]  Name: Whitney [name] MRN: [id number] Date of Birth: 1941/06/25

## 2015-10-21 NOTE — Telephone Encounter (Signed)
Pt states she has a night splint she used for her left foot and was wondering if she could use on the right since she was having some symptoms.  I informed pt she could use on either foot and encouraged her not to allow the problem to go on for too long without medical treatment, because the longer problem went without treatment the longer it may take to get better.  Pt agreed and states will call if problem continues.

## 2015-10-23 ENCOUNTER — Ambulatory Visit: Payer: Medicare Other | Admitting: Physical Therapy

## 2015-10-25 ENCOUNTER — Encounter: Payer: Self-pay | Admitting: Physical Therapy

## 2015-10-25 ENCOUNTER — Ambulatory Visit: Payer: Medicare Other | Admitting: Physical Therapy

## 2015-10-25 DIAGNOSIS — M25511 Pain in right shoulder: Secondary | ICD-10-CM

## 2015-10-25 DIAGNOSIS — M6281 Muscle weakness (generalized): Secondary | ICD-10-CM

## 2015-10-25 NOTE — Therapy (Signed)
[facility] [address], [place], [address] Phone: [phone]   Fax:  [phone]  Physical Therapy Treatment  Patient Details  Name: Whitney [name] MRN: [id number] Date of Birth: 1941-02-13 Referring Provider: Cindee Salt.,  MD  Encounter Date: 10/25/2015      PT End of Session - 10/25/15 0843    Visit Number 8   Number of Visits 12   Date for PT Re-Evaluation 11/18/15   Authorization Type 8   Authorization Time Period 10 G code   PT Start Time [phone]   PT Stop Time 0927   PT Time Calculation (min) 50 min   Activity Tolerance Patient tolerated treatment well   Behavior During Therapy [facility] for tasks assessed/performed      Past Medical History  Diagnosis Date  . Hypertension   . Diabetes mellitus without complication ([place])   . Allergy     History reviewed. No pertinent past surgical history.  There were no vitals filed for this visit.      Subjective Assessment - 10/25/15 0842    Subjective Patient reports she is following her precautions with more consistancy than last week and is noticing improvement with less pain in her right shoulder. [facility] and had [facility] soreness in her right arm/shoulder following exercises from her class. Today she arrives without pain in her right shoulder.    Limitations Lifting;House hold activities;Sitting   Patient Stated Goals Patient would like to get back to young at heart program and return to all activities without a problem without right arm pain   Currently in Pain? No/denies        Objective: Palpation: right shoulder with improved tone and mild muscle guarding and spasms along right shoulder, upper trapezius and right cervical spine paraspinals AROM: Pre treatment: right shoulder forward elevation to 150 without pain Posture: rounded shoulders, right scapula winging (decreased as compared to  previous session)   Treatment:  Manual therapy: STM cervical spine, and right shoulder with patient seated and supine lying in chair with back supported: decrease muscle spasms right side, improve soft tissue elasticity; followed by exercise Joint mobilization: scapulo thoracic lateral glide and rotation with patient in side lying  Exercise: patient performed exercises with guidance, verbal and tactile cues and demonstration of PT: Supine lying: cervical spine retraction supine lying with isometric cervical spine extension 5 reps each with tactile cues  Scapular retraction 2 x 10 reps with tactile cues, mild resistance given with 2 second holds Side lying: scapular control for elevation/depression and retraction with tactile cues 2 x 10 reps each Side lying shoulder ER with resistance given at end range and eccentric control through controlled ROM 2 x 5 reps Side lying shoulder flexion with assistance of PT and graded manual resistance through short arc (no GHJ grinding or pain) 2 x 10 reps Supine lying holding 2# weight for chest press 2 x 10 reps    Modalities: Moist heat applied to right shoulder x 10 min. Following exercises for pain: patient seated in chair with right UE supported on pillow: no adverse reactions noted following treatment  Patient response to treatment:  Improved soft tissue elasticity and decreased winging of right scapula at end of session. Patient verbalized good understanding of home program and reported decreased pain to 0-1/10 in right shoulder; demonstrated good technique with exercises and tolerated increased intensity with manual resistance for scapular strengthening  and forward flexion today            PT Education - 10/25/15 0843    Education provided Yes   Person(s) Educated Patient: re assessed home program and to stay within pain free ranges for exercise and to stop if exercises cause pain in right shoulder   Methods Explanation    Comprehension Verbalized understanding             PT Long Term Goals - 10/21/15 1101    PT LONG TERM GOAL #1   Title Patient will demonstrate improved functional use with decreased pain right UE as indicated by QuickDash score of 20% or less by 11/18/2015   Baseline quickDash 41%; work module 43.75% (curent 10/21/15: 34%)   Status Partially Met   PT LONG TERM GOAL #2   Title Patient will be independent with precautions for impingement right shoulder by 10/04/2015   Baseline patient has no knowledge of precautions for impingement right shoulder (10/02/2015: patient independent with knowledge of shoulder impingement precautions)   Status Achieved   PT LONG TERM GOAL #3   Title Patient will be independent with home program without cuing for pain control, exercises for flexiblity and strength to be able to self manage symptoms to continue progressing towards prior level of funciton by 11/18/2015   Status Revised               Plan - 10/25/15 0918    Clinical Impression Statement Patient is progressing well with decreased pain to 0/10 on arrival and no increased pain throughout session. She is improving strength and ROM which carries over into functional use at home and community. She continues with weakness and requires guidance for exercises to perform them in the correct ROM to avoid pain and grinding in her shoulder joint.    Rehab Potential Good   PT Frequency 2x / week   PT Duration 4 weeks   PT Next Visit Plan pain control, manual techniques for spasms, progressive ROM/strengthening exercises as indicated   PT Home Exercise Plan scapular control, shoulder ROM/strengthening exercises as instructed      Patient will benefit from skilled therapeutic intervention in order to improve the following deficits and impairments:  Decreased strength, Decreased knowledge of precautions, Pain, Impaired UE functional use, Increased muscle spasms, Decreased range of motion  Visit  Diagnosis: Pain in right shoulder  Muscle weakness (generalized)     Problem List Patient Active Problem List   Diagnosis Date Noted  . VULVAR VESTIBULITIS 07/30/2009    [name] PT 10/25/2015, 9:46 AM  [facility] [address], [place], [address] Phone: [phone]   Fax:  [phone]  Name: Whitney [name] MRN: [id number] Date of Birth: March 30, 1941

## 2015-10-28 ENCOUNTER — Encounter: Payer: Self-pay | Admitting: Physical Therapy

## 2015-10-28 ENCOUNTER — Ambulatory Visit: Payer: Medicare Other | Admitting: Physical Therapy

## 2015-10-28 ENCOUNTER — Encounter: Payer: Medicare Other | Admitting: Physical Therapy

## 2015-10-28 DIAGNOSIS — M6281 Muscle weakness (generalized): Secondary | ICD-10-CM

## 2015-10-28 DIAGNOSIS — M25511 Pain in right shoulder: Secondary | ICD-10-CM

## 2015-10-28 NOTE — Therapy (Signed)
[facility] [address], [place], [address] Phone: [phone]   Fax:  [phone]  Physical Therapy Treatment  Patient Details  Name: Whitney [name] MRN: [id number] Date of Birth: 12-Dec-1940 Referring Provider: Cindee Salt.,  MD  Encounter Date: 10/28/2015      PT End of Session - 10/28/15 0859    Visit Number 9   Number of Visits 12   Date for PT Re-Evaluation 11/18/15   Authorization Type 9   Authorization Time Period 10 G code   PT Start Time 0858   PT Stop Time 0925   PT Time Calculation (min) 27 min   Activity Tolerance Patient tolerated treatment well   Behavior During Therapy [facility] for tasks assessed/performed      Past Medical History  Diagnosis Date  . Hypertension   . Diabetes mellitus without complication ([place])   . Allergy     History reviewed. No pertinent past surgical history.  There were no vitals filed for this visit.      Subjective Assessment - 10/28/15 0858    Subjective Patient reports she is following her precautions with more consistancy with less pain in her right shoulder. She conitnues with weakness in her right shoulder that limits full pain free funciton with all tasks,  [facility]    Patient Stated Goals Patient would like to get back to young at heart program and return to all activities without a problem without right arm pain   Currently in Pain? No/denies        Objective: Palpation: right shoulder with improved tone and mild muscle guarding and spasms along right shoulder, upper trapezius and right cervical spine paraspinals AROM: Pre treatment: right shoulder forward elevation to 150 without pain Posture: rounded shoulders, right scapula winging (decreased as compared to previous session)   Treatment:  Manual therapy: STM cervical spine, and right shoulder with patient seated and supine lying in chair  with back supported: decrease muscle spasms right side, improve soft tissue elasticity; followed by exercise Joint mobilization: scapulo thoracic lateral glide and rotation with patient in side lying  Exercise: patient performed exercises with guidance, verbal and tactile cues and demonstration of PT: Supine lying: cervical spine retraction supine lying with isometric cervical spine extension 5 reps each with tactile cues  Scapular retraction x 10 reps with tactile cues, mild resistance given with 2 second holds Side lying: scapular control for elevation/depression and retraction with tactile cues 2 x 10 reps each Side lying shoulder ER with resistance given at end range and eccentric control through controlled ROM 2 x 5 reps Side lying shoulder flexion with assistance of PT and graded manual resistance through short arc (no GHJ grinding or pain) 2 x 10 reps Supine lying holding 2# weight for chest press x 10 reps     Patient response to treatment:  Improved soft tissue elasticity and decreased winging of right scapula at end of session. Patient verbalized good understanding of home program. demonstrated good technique with exercises and tolerated increased intensity with manual resistance for scapular strengthening and forward flexion today; fatigued at end of session           PT Long Term Goals - 10/21/15 1101    PT LONG TERM GOAL #1   Title Patient will demonstrate improved functional use with decreased pain right UE as indicated by QuickDash score of 20% or less by  11/18/2015   Baseline quickDash 41%; work module 43.75% (curent 10/21/15: 34%)   Status Partially Met   PT LONG TERM GOAL #2   Title Patient will be independent with precautions for impingement right shoulder by 10/04/2015   Baseline patient has no knowledge of precautions for impingement right shoulder (10/02/2015: patient independent with knowledge of shoulder impingement precautions)   Status Achieved   PT LONG  TERM GOAL #3   Title Patient will be independent with home program without cuing for pain control, exercises for flexiblity and strength to be able to self manage symptoms to continue progressing towards prior level of funciton by 11/18/2015   Status Revised               Plan - 10/28/15 0956    Clinical Impression Statement Patient is progressing well with improving muscle strength and decreased joint pain in rigth shoulder. She is transitioning to her exercise class again and seems to being doing well with this. She continues with weakness and requires guidance to perform exercises with modifications.    Rehab Potential Good   PT Frequency 2x / week   PT Duration 4 weeks   PT Treatment/Interventions Manual techniques;Patient/family education;Electrical Stimulation;Therapeutic exercise;Moist Heat;Ultrasound   PT Next Visit Plan pain control, manual techniques for spasms, progressive ROM/strengthening exercises as indicated, re assess quickDash and anticipate discharge to independence.    PT Home Exercise Plan scapular control, shoulder ROM/strengthening exercises as instructed      Patient will benefit from skilled therapeutic intervention in order to improve the following deficits and impairments:  Decreased strength, Decreased knowledge of precautions, Pain, Impaired UE functional use, Increased muscle spasms, Decreased range of motion  Visit Diagnosis: Pain in right shoulder  Muscle weakness (generalized)     Problem List Patient Active Problem List   Diagnosis Date Noted  . VULVAR VESTIBULITIS 07/30/2009    [name] PT 10/28/2015, 12:10 PM  [facility] [address], [place], [address] Phone: [phone]   Fax:  [phone]  Name: Whitney [name] MRN: [id number] Date of Birth: 1941/01/11

## 2015-10-30 ENCOUNTER — Ambulatory Visit: Payer: Medicare Other | Admitting: Physical Therapy

## 2015-10-30 ENCOUNTER — Encounter: Payer: Self-pay | Admitting: Physical Therapy

## 2015-10-30 DIAGNOSIS — M25511 Pain in right shoulder: Secondary | ICD-10-CM

## 2015-10-30 DIAGNOSIS — M6281 Muscle weakness (generalized): Secondary | ICD-10-CM

## 2015-10-30 NOTE — Therapy (Signed)
[facility] [address], [place], [address] Phone: [phone]   Fax:  [phone]  Physical Therapy Treatment/Discharge Summary  Patient Details  Name: Whitney [name] MRN: [id number] Date of Birth: 08-24-1940 Referring Provider: Cindee Salt.,  MD  Encounter Date: 10/30/2015   Patient began physical therapy 09/24/2015 and attended 10 session through 10/30/3015 with goals achieved and patient reporting at least 80% improvement since beginning physical therapy. She is ready for discharge from physical therapy and should continue to improve with self management of home program and transition back to [facility] program.       PT End of Session - 10/30/15 0930    Visit Number 10   Number of Visits 12   Date for PT Re-Evaluation 11/18/15   Authorization Type 10   Authorization Time Period 10 G code   PT Start Time 0843   PT Stop Time 0927   PT Time Calculation (min) 44 min   Activity Tolerance Patient tolerated treatment well   Behavior During Therapy [facility] for tasks assessed/performed      Past Medical History  Diagnosis Date  . Hypertension   . Diabetes mellitus without complication ([place])   . Allergy     History reviewed. No pertinent past surgical history.  There were no vitals filed for this visit.      Subjective Assessment - 10/30/15 0846    Subjective Patient is transitioning back to young [facility] program with modifications and is able to perform daily tasks such as personal care and household chores with minimal to no dificulty due to right shoulder. Patient reports she is at least 80% improved since beginning therapy and agrees to discharge at this time.    Limitations Lifting;House hold activities;Sitting   Patient Stated Goals Patient would like to get back to young at heart program and return to all activities without a problem without right arm pain   Currently in  Pain? No/denies  just a little stiff      Objective: Palpation: right shoulder with improved tone and mild muscle guarding and spasms along right shoulder, upper trapezius and right cervical spine paraspinals AROM: Pre treatment: right shoulder forward elevation to 150+ without pain Posture: rounded shoulders, mild right scapula winging (decreased as compared to previous session)  Outcome measure: QuickDash 25% (mild self perceived disability)  Treatment:  Exercise: patient performed exercises with guidance, verbal and tactile cues and demonstration of PT: Supine lying: cervical spine retraction supine lying with isometric cervical spine extension 5 reps each with tactile cues  Scapular retraction x 10 reps with tactile cues, mild resistance given with 2 second holds Side lying: scapular control for elevation/depression and retraction with tactile cues 2 x 10 reps each Side lying shoulder ER with resistance given at end range and eccentric control through controlled ROM 2 x 5 reps Supine lying AA chest press x 10 reps     Patient response to treatment:  Improved soft tissue elasticity and decreased winging of right scapula at end of session. Patient verbalized good understanding of home program. demonstrated good technique with exercises with minimal cuing and verbalized good understanding of home program following instruction and going over handout             PT Education - 10/30/15 0930    Education provided Yes   Education Details re assessed home program: scapular control, retraction, pendulums, shoulder ER,    Person(s) Educated  Patient   Methods Explanation;Demonstration;Verbal cues;Handout   Comprehension Verbalized understanding;Returned demonstration;Verbal cues required             PT Long Term Goals - 11-04-2015 1546    PT LONG TERM GOAL #1   Title Patient will demonstrate improved functional use with decreased pain right UE as indicated by QuickDash  score of 20% or less by 11/18/2015   Baseline quickDash 41%; work module 43.75% (curent 10/21/15: 34%)  Discharge Nov 04, 2022; 25%   Status Achieved   PT LONG TERM GOAL #2   Title Patient will be independent with precautions for impingement right shoulder by 10/04/2015   Baseline patient has no knowledge of precautions for impingement right shoulder (10/02/2015: patient independent with knowledge of shoulder impingement precautions)   Status Achieved   PT LONG TERM GOAL #3   Title Patient will be independent with home program without cuing for pain control, exercises for flexiblity and strength to be able to self manage symptoms to continue progressing towards prior level of funciton by 11/18/2015   Baseline limited knowledge of appropriate pain control strategies/exercise progression to achieve goals and return to prior level of function   Status Achieved               Plan - 11/04/2015 0930    Clinical Impression Statement Patient has achieved all goals and is independent with home program. She has minimal winging of right scapula and is able to correct independently. She has good understanding of home program and should continue to improve strength and endurance for daily functional activities with self managemnt.    Rehab Potential Good   PT Frequency 2x / week   PT Duration 4 weeks   PT Treatment/Interventions Manual techniques;Patient/family education;Electrical Stimulation;Therapeutic exercise;Moist Heat;Ultrasound   PT Next Visit Plan discharge from physcal therapy   PT Home Exercise Plan scapular control, shoulder ROM/strengthening exercises as instructed   Consulted and Agree with Plan of Care Patient      Patient will benefit from skilled therapeutic intervention in order to improve the following deficits and impairments:  Decreased strength, Decreased knowledge of precautions, Pain, Impaired UE functional use, Increased muscle spasms, Decreased range of motion  Visit Diagnosis: Pain  in right shoulder  Muscle weakness (generalized)       G-Codes - 11/04/15 1549    Functional Assessment Tool Used QuickDash, pain scale, ROM and strength deficits, clinical judgment   Functional Limitation Carrying, moving and handling objects   Carrying, Moving and Handling Objects Goal Status [id number]) At least 1 percent but less than 20 percent impaired, limited or restricted   Carrying, Moving and Handling Objects Discharge Status [id number]) At least 1 percent but less than 20 percent impaired, limited or restricted      Problem List Patient Active Problem List   Diagnosis Date Noted  . VULVAR VESTIBULITIS 07/30/2009    [name] PT 2015-11-04, 3:51 PM  [facility] [address], [place], [address] Phone: [phone]   Fax:  [phone]  Name: Whitney [name] MRN: [id number] Date of Birth: November 01, 1940

## 2015-10-31 ENCOUNTER — Other Ambulatory Visit: Payer: Self-pay | Admitting: Obstetrics and Gynecology

## 2015-10-31 DIAGNOSIS — Z1231 Encounter for screening mammogram for malignant neoplasm of breast: Secondary | ICD-10-CM

## 2015-11-01 ENCOUNTER — Encounter: Payer: Medicare Other | Admitting: Physical Therapy

## 2015-11-04 ENCOUNTER — Encounter: Payer: Medicare Other | Admitting: Physical Therapy

## 2015-11-05 ENCOUNTER — Encounter: Payer: Medicare Other | Admitting: Physical Therapy

## 2015-11-06 ENCOUNTER — Encounter: Payer: Medicare Other | Admitting: Physical Therapy

## 2015-11-07 ENCOUNTER — Encounter: Payer: Medicare Other | Admitting: Physical Therapy

## 2015-11-11 ENCOUNTER — Encounter: Payer: Medicare Other | Admitting: Physical Therapy

## 2015-11-12 ENCOUNTER — Encounter: Payer: Medicare Other | Admitting: Physical Therapy

## 2015-11-13 ENCOUNTER — Encounter: Payer: Medicare Other | Admitting: Physical Therapy

## 2015-11-14 ENCOUNTER — Encounter: Payer: Medicare Other | Admitting: Physical Therapy

## 2015-11-18 ENCOUNTER — Encounter: Payer: Medicare Other | Admitting: Physical Therapy

## 2015-11-19 ENCOUNTER — Encounter: Payer: Medicare Other | Admitting: Physical Therapy

## 2015-11-19 ENCOUNTER — Ambulatory Visit: Payer: Medicare Other

## 2015-11-20 ENCOUNTER — Encounter: Payer: Medicare Other | Admitting: Physical Therapy

## 2015-11-21 ENCOUNTER — Encounter: Payer: Medicare Other | Admitting: Physical Therapy

## 2015-11-25 ENCOUNTER — Encounter: Payer: Medicare Other | Admitting: Physical Therapy

## 2015-11-26 ENCOUNTER — Encounter: Payer: Medicare Other | Admitting: Physical Therapy

## 2015-11-27 ENCOUNTER — Ambulatory Visit (INDEPENDENT_AMBULATORY_CARE_PROVIDER_SITE_OTHER): Payer: Medicare Other

## 2015-11-27 ENCOUNTER — Encounter: Payer: Self-pay | Admitting: Podiatry

## 2015-11-27 ENCOUNTER — Encounter: Payer: Medicare Other | Admitting: Physical Therapy

## 2015-11-27 ENCOUNTER — Ambulatory Visit (INDEPENDENT_AMBULATORY_CARE_PROVIDER_SITE_OTHER): Payer: Medicare Other | Admitting: Podiatry

## 2015-11-27 ENCOUNTER — Ambulatory Visit: Payer: Medicare Other

## 2015-11-27 VITALS — BP 149/64 | HR 68 | Resp 16

## 2015-11-27 DIAGNOSIS — M722 Plantar fascial fibromatosis: Secondary | ICD-10-CM | POA: Diagnosis not present

## 2015-11-27 NOTE — Progress Notes (Signed)
Subjective:     Patient ID: Whitney [name], female   DOB: 07-05-1941, 74 y.o.   MRN: [id number]  HPI patient states that she has severe pain in the plantar aspect of the left heel that has not responded to all the different types treatments we've done and it is affecting her ability to be active   Review of Systems     Objective:   Physical Exam Neurovascular status intact muscle strength adequate with exquisite discomfort plantar aspect left heel at the insertional point tendon the calcaneus with discomfort also occurring posteriorly as she walks differently and starting to develop in the right plantar heel discomfort not to the same degree. Negative Homan sign is noted and good digital perfusion is noted    Assessment:     Acute plantar fasciitis left with inflammation and fluid around the medial band that has failed to respond to numerous conservative treatments    Plan:     H&P and condition reviewed with patient. At this point due to long-standing nature and failure to respond to numerous conservative cares I've recommended surgical intervention with endoscopic procedure. I allowed her to read consent form reviewing alternative treatments complications and I reviewed her x-rays today and discussed the surgical procedure and risk. Patient wants surgery understanding all risk and that recovery can take approximate 6 months and that she will need to wear her boot postoperatively. Scheduled for outpatient surgery in is encouraged to call with questions  X-ray report indicate spur formation bilateral with depression of the arch noted and mild osteoporosis

## 2015-11-27 NOTE — Patient Instructions (Addendum)
Plantar Fasciitis (Heel Spur Syndrome) with Rehab The plantar fascia is a fibrous, ligament-like, soft-tissue structure that spans the bottom of the foot. Plantar fasciitis is a condition that causes pain in the foot due to inflammation of the tissue. SYMPTOMS  1. Pain and tenderness on the underneath side of the foot. 2. Pain that worsens with standing or walking. CAUSES  Plantar fasciitis is caused by irritation and injury to the plantar fascia on the underneath side of the foot. Common mechanisms of injury include: 1. Direct trauma to bottom of the foot. 2. Damage to a small nerve that runs under the foot where the main fascia attaches to the heel bone. 3. Stress placed on the plantar fascia due to bone spurs. RISK INCREASES WITH:   Activities that place stress on the plantar fascia (running, jumping, pivoting, or cutting).  Poor strength and flexibility.  Improperly fitted shoes.  Tight calf muscles.  Flat feet.  Failure to warm-up properly before activity.  Obesity. PREVENTION  Warm up and stretch properly before activity.  Allow for adequate recovery between workouts.  Maintain physical fitness:  Strength, flexibility, and endurance.  Cardiovascular fitness.  Maintain a health body weight.  Avoid stress on the plantar fascia.  Wear properly fitted shoes, including arch supports for individuals who have flat feet.  PROGNOSIS  If treated properly, then the symptoms of plantar fasciitis usually resolve without surgery. However, occasionally surgery is necessary.  RELATED COMPLICATIONS   Recurrent symptoms that may result in a chronic condition.  Problems of the lower back that are caused by compensating for the injury, such as limping.  Pain or weakness of the foot during push-off following surgery.  Chronic inflammation, scarring, and partial or complete fascia tear, occurring more often from repeated injections.  TREATMENT  Treatment initially involves  the use of ice and medication to help reduce pain and inflammation. The use of strengthening and stretching exercises may help reduce pain with activity, especially stretches of the Achilles tendon. These exercises may be performed at home or with a therapist. Your caregiver may recommend that you use heel cups of arch supports to help reduce stress on the plantar fascia. Occasionally, corticosteroid injections are given to reduce inflammation. If symptoms persist for greater than 6 months despite non-surgical (conservative), then surgery may be recommended.   MEDICATION   If pain medication is necessary, then nonsteroidal anti-inflammatory medications, such as aspirin and ibuprofen, or other minor pain relievers, such as acetaminophen, are often recommended.  Do not take pain medication within 7 days before surgery.  Prescription pain relievers may be given if deemed necessary by your caregiver. Use only as directed and only as much as you need.  Corticosteroid injections may be given by your caregiver. These injections should be reserved for the most serious cases, because they may only be given a certain number of times.  HEAT AND COLD  Cold treatment (icing) relieves pain and reduces inflammation. Cold treatment should be applied for 10 to 15 minutes every 2 to 3 hours for inflammation and pain and immediately after any activity that aggravates your symptoms. Use ice packs or massage the area with a piece of ice (ice massage).  Heat treatment may be used prior to performing the stretching and strengthening activities prescribed by your caregiver, physical therapist, or athletic trainer. Use a heat pack or soak the injury in warm water.  SEEK IMMEDIATE MEDICAL CARE IF:  Treatment seems to offer no benefit, or the condition worsens.  Any medications  produce adverse side effects.  EXERCISES- RANGE OF MOTION (ROM) AND STRETCHING EXERCISES - Plantar Fasciitis (Heel Spur Syndrome) These  exercises may help you when beginning to rehabilitate your injury. Your symptoms may resolve with or without further involvement from your physician, physical therapist or athletic trainer. While completing these exercises, remember:   Restoring tissue flexibility helps normal motion to return to the joints. This allows healthier, less painful movement and activity.  An effective stretch should be held for at least 30 seconds.  A stretch should never be painful. You should only feel a gentle lengthening or release in the stretched tissue.  RANGE OF MOTION - Toe Extension, Flexion  Sit with your right / left leg crossed over your opposite knee.  Grasp your toes and gently pull them back toward the top of your foot. You should feel a stretch on the bottom of your toes and/or foot.  Hold this stretch for 10 seconds.  Now, gently pull your toes toward the bottom of your foot. You should feel a stretch on the top of your toes and or foot.  Hold this stretch for 10 seconds. Repeat  times. Complete this stretch 3 times per day.   RANGE OF MOTION - Ankle Dorsiflexion, Active Assisted  Remove shoes and sit on a chair that is preferably not on a carpeted surface.  Place right / left foot under knee. Extend your opposite leg for support.  Keeping your heel down, slide your right / left foot back toward the chair until you feel a stretch at your ankle or calf. If you do not feel a stretch, slide your bottom forward to the edge of the chair, while still keeping your heel down.  Hold this stretch for 10 seconds. Repeat 3 times. Complete this stretch 2 times per day.   STRETCH  Gastroc, Standing  Place hands on wall.  Extend right / left leg, keeping the front knee somewhat bent.  Slightly point your toes inward on your back foot.  Keeping your right / left heel on the floor and your knee straight, shift your weight toward the wall, not allowing your back to arch.  You should feel a gentle  stretch in the right / left calf. Hold this position for 10 seconds. Repeat 3 times. Complete this stretch 2 times per day.  STRETCH  Soleus, Standing  Place hands on wall.  Extend right / left leg, keeping the other knee somewhat bent.  Slightly point your toes inward on your back foot.  Keep your right / left heel on the floor, bend your back knee, and slightly shift your weight over the back leg so that you feel a gentle stretch deep in your back calf.  Hold this position for 10 seconds. Repeat 3 times. Complete this stretch 2 times per day.  STRETCH  Gastrocsoleus, Standing  Note: This exercise can place a lot of stress on your foot and ankle. Please complete this exercise only if specifically instructed by your caregiver.   Place the ball of your right / left foot on a step, keeping your other foot firmly on the same step.  Hold on to the wall or a rail for balance.  Slowly lift your other foot, allowing your body weight to press your heel down over the edge of the step.  You should feel a stretch in your right / left calf.  Hold this position for 10 seconds.  Repeat this exercise with a slight bend in your right /  left knee. Repeat 3 times. Complete this stretch 2 times per day.   STRENGTHENING EXERCISES - Plantar Fasciitis (Heel Spur Syndrome)  These exercises may help you when beginning to rehabilitate your injury. They may resolve your symptoms with or without further involvement from your physician, physical therapist or athletic trainer. While completing these exercises, remember:   Muscles can gain both the endurance and the strength needed for everyday activities through controlled exercises.  Complete these exercises as instructed by your physician, physical therapist or athletic trainer. Progress the resistance and repetitions only as guided.  STRENGTH - Towel Curls  Sit in a chair positioned on a non-carpeted surface.  Place your foot on a towel, keeping  your heel on the floor.  Pull the towel toward your heel by only curling your toes. Keep your heel on the floor. Repeat 3 times. Complete this exercise 2 times per day.  STRENGTH - Ankle Inversion  Secure one end of a rubber exercise band/tubing to a fixed object (table, pole). Loop the other end around your foot just before your toes.  Place your fists between your knees. This will focus your strengthening at your ankle.  Slowly, pull your big toe up and in, making sure the band/tubing is positioned to resist the entire motion.  Hold this position for 10 seconds.  Have your muscles resist the band/tubing as it slowly pulls your foot back to the starting position. Repeat 3 times. Complete this exercises 2 times per day.  Document Released: 06/22/2005 Document Revised: 09/14/2011 Document Reviewed: 10/04/2008 [facility] Patient Information 2014 [place], [place].  Pre-Operative Instructions  Congratulations, you have decided to take an important step to improving your quality of life.  You can be assured that the doctors of [facility] will be with you every step of the way.  3. Plan to be at the surgery center/hospital at least 1 (one) hour prior to your scheduled time unless otherwise directed by the surgical center/hospital staff.  You must have a responsible adult accompany you, remain during the surgery and drive you home.  Make sure you have directions to the surgical center/hospital and know how to get there on time. 4. For hospital based surgery you will need to obtain a history and physical form from your family physician within 1 month prior to the date of surgery- we will give you a form for you primary physician.  5. We make every effort to accommodate the date you request for surgery.  There are however, times where surgery dates or times have to be moved.  We will contact you as soon as possible if a change in schedule is required.   6. No Aspirin/Ibuprofen for one week  before surgery.  If you are on aspirin, any non-steroidal anti-inflammatory medications (Mobic, Aleve, Ibuprofen) you should stop taking it 7 days prior to your surgery.  You make take Tylenol  For pain prior to surgery.  7. Medications- If you are taking daily heart and blood pressure medications, seizure, reflux, allergy, asthma, anxiety, pain or diabetes medications, make sure the surgery center/hospital is aware before the day of surgery so they may notify you which medications to take or avoid the day of surgery. 8. No food or drink after midnight the night before surgery unless directed otherwise by surgical center/hospital staff. 9. No alcoholic beverages 24 hours prior to surgery.  No smoking 24 hours prior to or 24 hours after surgery. 10. Wear loose pants or shorts- loose enough to fit over  bandages, boots, and casts. 11. No slip on shoes, sneakers are best. 12. Bring your boot with you to the surgery center/hospital.  Also bring crutches or a walker if your physician has prescribed it for you.  If you do not have this equipment, it will be provided for you after surgery. 53. If you have not been contracted by the surgery center/hospital by the day before your surgery, call to confirm the date and time of your surgery. 14. Leave-time from work may vary depending on the type of surgery you have.  Appropriate arrangements should be made prior to surgery with your employer. 15. Prescriptions will be provided immediately following surgery by your doctor.  Have these filled as soon as possible after surgery and take the medication as directed. 16. Remove nail polish on the operative foot. 46. Wash the night before surgery.  The night before surgery wash the foot and leg well with the antibacterial soap provided and water paying special attention to beneath the toenails and in between the toes.  Rinse thoroughly with water and dry well with a towel.  Perform this wash unless told not to do so by your  physician.  Enclosed: 1 Ice pack (please put in freezer the night before surgery)   1 Hibiclens skin cleaner   Pre-op Instructions  If you have any questions regarding the instructions, do not hesitate to call our office.  [place]: [place], [place] [address] [place]: [address] [place], [place] [address] [phone]  [place]: [address],  [phone]   Dr. Ila Mcgill DPM, Dr. Celesta Gentile DPM, Dr. Lanelle Bal DPM, Dr. Landis Martins DPM

## 2015-11-28 ENCOUNTER — Encounter: Payer: Medicare Other | Admitting: Physical Therapy

## 2015-12-03 ENCOUNTER — Telehealth: Payer: Self-pay | Admitting: *Deleted

## 2015-12-03 NOTE — Telephone Encounter (Signed)
"  I'm a patient of Dr. Paulla Dolly.  He and I have decided I need surgery for Plantar Fasciitis.  I need an appointment.  If you call my home number and don't get an answer, you can reach me on my cell.  Thank you very much."

## 2015-12-03 NOTE — Telephone Encounter (Signed)
"  I'm calling to make a surgical appointment with Ms. Gearl Kimbrough, the surgical coordinator."

## 2015-12-03 NOTE — Telephone Encounter (Signed)
"  I'm a patient of Dr. Mellody Drown.  I was told to call you to set up my surgery."  Do you have a date in mind?  "I'd like to do it June 13."  That date is not available, Dr. Paulla Dolly will be out of the office.  "How about June 20, is that date okay?"  Yes, that date is available.  Someone from the surgical center will call you on the Friday or Monday before with the arrival time.  "Okay, thank you so much."

## 2015-12-04 ENCOUNTER — Encounter: Payer: Medicare Other | Admitting: Physical Therapy

## 2015-12-18 ENCOUNTER — Telehealth: Payer: Self-pay | Admitting: *Deleted

## 2015-12-18 NOTE — Telephone Encounter (Signed)
"  I'm having surgery on Tuesday with Dr. Paulla Dolly.  I wanted to verify that my insurance is in line and that everything is okay for me to have this surgery.  Call me back."

## 2015-12-19 NOTE — Telephone Encounter (Signed)
I'm returning your call.  Medicare does not require pre-certification for surgery.  If you do not have regular Medicare please give me a call back.  You are okay to proceed with surgery.

## 2015-12-24 ENCOUNTER — Encounter: Payer: Self-pay | Admitting: Podiatry

## 2015-12-24 DIAGNOSIS — M722 Plantar fascial fibromatosis: Secondary | ICD-10-CM

## 2015-12-25 ENCOUNTER — Telehealth: Payer: Self-pay | Admitting: *Deleted

## 2015-12-25 NOTE — Telephone Encounter (Addendum)
Post op courtesy call-Pt states she is doing good, but said she has sleep apnea and wanted to know if she can take the Meperidine.  Dr. Paulla Dolly states can take but not at bedtime.  Informed pt and she states understanding and will take Tylenol at bedtime.  I instructed pt not to weight bear or dangle foot more than 15 mins/hour, keep the original dressing in place clean and dry, remain in boot to weight bear, ice 15 mins/every hour for 72 hours, then as needed, and to call with concerns. Pt states understanding. 12/30/2015-Pt asked how long she could be up on her foot this week, since she was to have her 1st POV on the 01/02/2016, and how to shower.  I told pt she still needed to keep her weight bearing and dangling to as close to 15 mins/hour as possible due to swelling and pain.  Pt asked if the stitches would come out at this visit, and I told her it generally was during the 15 - 21 day time frame for that.  Pt asked if she could shower. I told pt it would be best if she birdbathed or had a sitdown shower, covered the leg and foot with a towel and plastic bag, and showered with the hose type shower. Pt states understanding. 01/23/2016-Pt states she is still wearing the two different surgery shoes and was told to wrap feet with ace wrap before bed every night, how long should this continue. 01/24/2016-Dr. Regal states pt can go back in to athletic shoes while wearing the compression anklet. Left message requesting call back. Pt returned call states can call on cellphone.I spoke with pt and informed that Dr. Paulla Dolly said she could begin wearing the compression sock with the athletic shoes.  I informed pt and gave instructions to begin wearing the athletic shoe in the house as long as it was comfortable, once uncomfortable go in to the surgical shoe until comfortable and switch back to athletic shoe, alternate athletic and surgical shoes as comfortable, remain in the surgical shoe when outside of the house or where not  convenient to change back and forth in shoe, until she can wear the athletic shoe all day in the house.  Pt states understanding.

## 2016-01-02 ENCOUNTER — Encounter: Payer: Self-pay | Admitting: Podiatry

## 2016-01-02 ENCOUNTER — Ambulatory Visit (INDEPENDENT_AMBULATORY_CARE_PROVIDER_SITE_OTHER): Payer: Medicare Other | Admitting: Podiatry

## 2016-01-02 VITALS — Temp 98.7°F

## 2016-01-02 DIAGNOSIS — M722 Plantar fascial fibromatosis: Secondary | ICD-10-CM

## 2016-01-02 DIAGNOSIS — Z9889 Other specified postprocedural states: Secondary | ICD-10-CM

## 2016-01-02 NOTE — Progress Notes (Signed)
Subjective:     Patient ID: Whitney [name], female   DOB: 1941/05/04, 75 y.o.   MRN: [id number]  HPI patient states she's doing very well with her left foot   Review of Systems     Objective:   Physical Exam Neurovascular status intact with significant diminishment of discomfort plantar left with stitches intact medial and lateral side    Assessment:     Doing well post endoscopic surgery left heel    Plan:     Sterile dressing reapplied and instructed on continued boot or surgical shoe usage. Reappoint one week for stitch removal

## 2016-01-09 ENCOUNTER — Encounter: Payer: Self-pay | Admitting: Podiatry

## 2016-01-09 ENCOUNTER — Ambulatory Visit (INDEPENDENT_AMBULATORY_CARE_PROVIDER_SITE_OTHER): Payer: Medicare Other | Admitting: Podiatry

## 2016-01-09 VITALS — BP 156/72 | HR 66 | Resp 16

## 2016-01-09 DIAGNOSIS — M722 Plantar fascial fibromatosis: Secondary | ICD-10-CM

## 2016-01-09 DIAGNOSIS — Z9889 Other specified postprocedural states: Secondary | ICD-10-CM

## 2016-01-09 NOTE — Progress Notes (Signed)
Subjective:     Patient ID: Whitney [name], female   DOB: 26-Sep-1940, 75 y.o.   MRN: [id number]  HPI patient states I'm doing real well with minimal discomfort or other problems   Review of Systems     Objective:   Physical Exam Neurovascular status intact negative Homans sign noted with wound edges well coapted medial and lateral side of the left    Assessment:     Doing well post endoscopic surgery left    Plan:     Stitches removed wound edges well coapted and applied a anklet to the foot with instructions on continued elevation and immobilization. Reappoint as needed and this should heal uneventfully

## 2016-01-24 ENCOUNTER — Telehealth: Payer: Self-pay | Admitting: *Deleted

## 2016-01-24 ENCOUNTER — Ambulatory Visit
Admission: RE | Admit: 2016-01-24 | Discharge: 2016-01-24 | Disposition: A | Discharge status: Home / Self Care | Payer: Medicare Other | Source: Ambulatory Visit | Attending: Obstetrics and Gynecology | Admitting: Obstetrics and Gynecology

## 2016-01-24 ENCOUNTER — Other Ambulatory Visit: Payer: Self-pay | Admitting: Obstetrics and Gynecology

## 2016-01-24 DIAGNOSIS — Z1231 Encounter for screening mammogram for malignant neoplasm of breast: Secondary | ICD-10-CM | POA: Insufficient documentation

## 2016-01-24 NOTE — Telephone Encounter (Signed)
Entered in error

## 2016-04-02 NOTE — Progress Notes (Signed)
DOS 06.20.2017 Endoscopic Release Medial Band Left Heel

## 2016-06-03 ENCOUNTER — Ambulatory Visit (INDEPENDENT_AMBULATORY_CARE_PROVIDER_SITE_OTHER): Payer: Medicare Other

## 2016-06-03 ENCOUNTER — Ambulatory Visit (INDEPENDENT_AMBULATORY_CARE_PROVIDER_SITE_OTHER): Payer: Medicare Other | Admitting: Podiatry

## 2016-06-03 ENCOUNTER — Encounter: Payer: Self-pay | Admitting: Podiatry

## 2016-06-03 VITALS — BP 136/63 | HR 57 | Resp 16

## 2016-06-03 DIAGNOSIS — Z9889 Other specified postprocedural states: Secondary | ICD-10-CM

## 2016-06-03 DIAGNOSIS — M722 Plantar fascial fibromatosis: Secondary | ICD-10-CM

## 2016-06-03 MED ORDER — TRIAMCINOLONE ACETONIDE 10 MG/ML IJ SUSP
10.0000 mg | Freq: Once | INTRAMUSCULAR | Status: AC
  Administered 2016-06-03: 10 mg

## 2016-06-04 NOTE — Progress Notes (Signed)
Subjective:     Patient ID: Whitney [name], female   DOB: 09-06-1940, 75 y.o.   MRN: [id number]  HPI patient presents with pain in the outside of the left heel and states that she's having a lot of problems with her back also   Review of Systems     Objective:   Physical Exam Neurovascular status intact with inflammation pain in the plantar left lateral heel with fluid buildup and soreness when palpated    Assessment:     Plantar fasciitis left heel lateral plantar side    Plan:     Careful injection administered to the plantar lateral heel 3 mg Kenalog 5 mg Xylocaine and instructed on physical therapy anti-inflammatories and supportive shoe gear and will be seen back to recheck again in the next 4 weeks  X-rays indicate no signs of stress fracture or advanced arthritic issues with spur formation

## 2016-06-05 ENCOUNTER — Telehealth: Payer: Self-pay

## 2016-06-05 ENCOUNTER — Ambulatory Visit: Payer: Medicare Other | Admitting: Podiatry

## 2016-06-05 NOTE — Telephone Encounter (Signed)
Spoke with pt informing her on the use of compression device that were given to her at her last visit

## 2016-07-15 ENCOUNTER — Ambulatory Visit: Payer: Medicare Other | Admitting: Podiatry

## 2016-10-23 ENCOUNTER — Ambulatory Visit (INDEPENDENT_AMBULATORY_CARE_PROVIDER_SITE_OTHER): Payer: Medicare Other | Admitting: Podiatry

## 2016-10-23 ENCOUNTER — Encounter: Payer: Self-pay | Admitting: Podiatry

## 2016-10-23 DIAGNOSIS — M722 Plantar fascial fibromatosis: Secondary | ICD-10-CM | POA: Diagnosis not present

## 2016-10-23 DIAGNOSIS — L03032 Cellulitis of left toe: Secondary | ICD-10-CM | POA: Diagnosis not present

## 2016-10-23 MED ORDER — TRIAMCINOLONE ACETONIDE 10 MG/ML IJ SUSP
10.0000 mg | Freq: Once | INTRAMUSCULAR | Status: AC
  Administered 2016-10-23: 10 mg

## 2016-10-23 NOTE — Patient Instructions (Signed)

## 2016-10-23 NOTE — Progress Notes (Signed)
Subjective:     Patient ID: Whitney [name], female   DOB: 1941/06/23, 76 y.o.   MRN: [id number]  HPI patient presents with a lot of pain in the left will heal and also a painful nailbed left hallux lateral border with [place] of [place] approximately 2 weeks and is hoping to have something done   Review of Systems     Objective:   Physical Exam Neurovascular status intact muscle strength was adequate inflammation and pain of the left plantar heel at the insertional point tendon calcaneus and incurvated lateral border left hallux with distal redness but no active drainage    Assessment:     Plantar fasciitis left heel acute in nature with mild localized paronychia infection left hallux    Plan:     H&P both conditions reviewed and injected the plantar fascial left 3 mg Kenalog 5 mg Xylocaine and infiltrated the left hallux 60 mg like Marcaine mixture and under sterile conditions remove the lateral border all proud flesh and allowed channel for drainage. Reappoint for [place] to recheck

## 2016-11-13 IMAGING — MG MM DIGITAL SCREENING BILAT W/ TOMO W/ CAD
8 of 13 series · 8 of 29 positions shown · non-contrast
Comparison: Previous exam(s).

CLINICAL DATA: Screening.

EXAM:
2D DIGITAL SCREENING BILATERAL MAMMOGRAM WITH CAD AND ADJUNCT TOMO

[R MLO (1 of 2)]
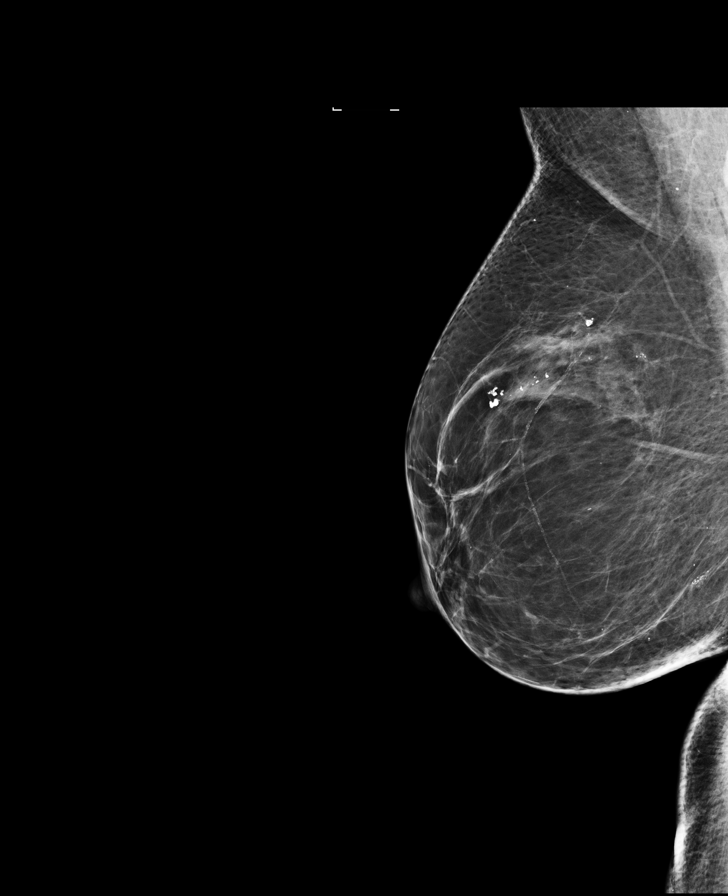

[L MLO synth-2D]
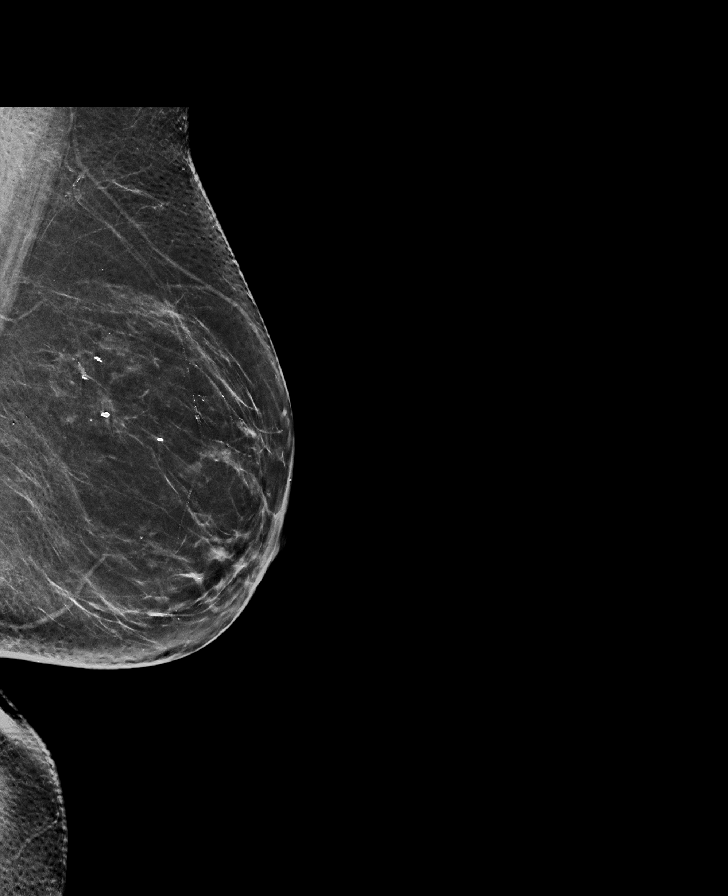

[R MLO (2 of 2)]
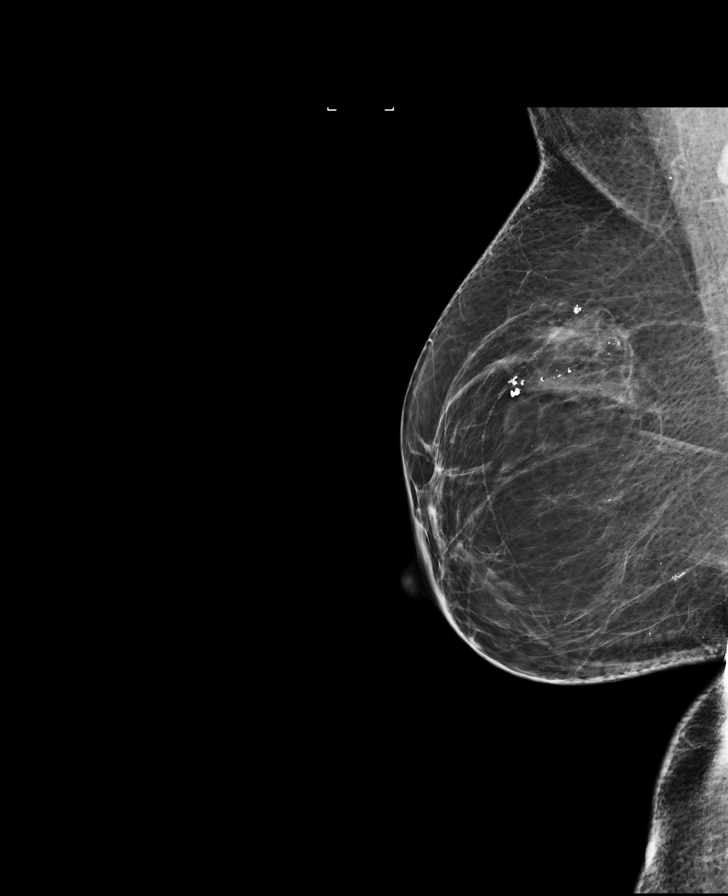

[R CC]
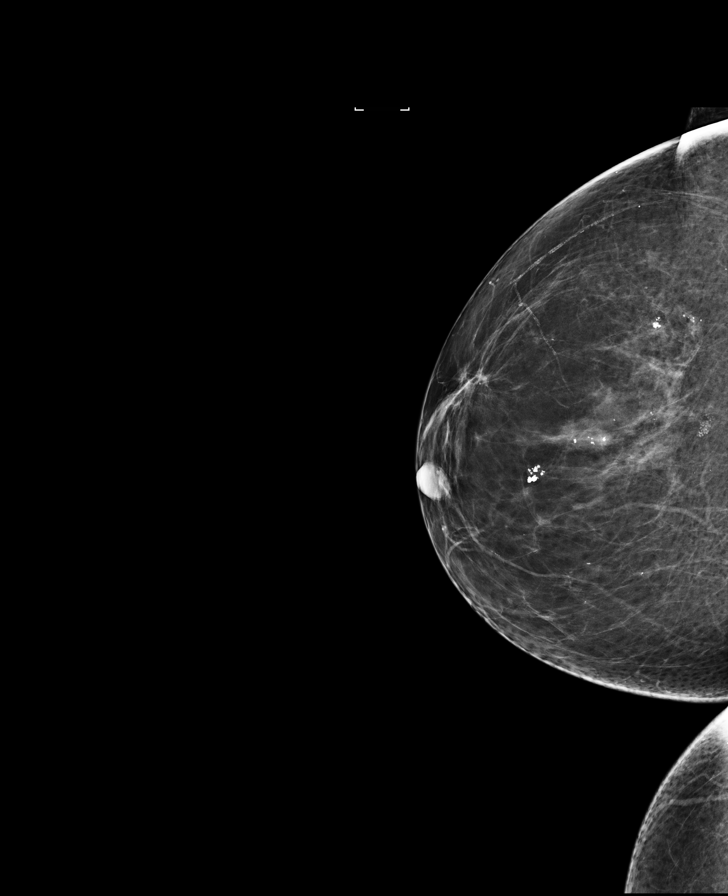

[L CC synth-2D]
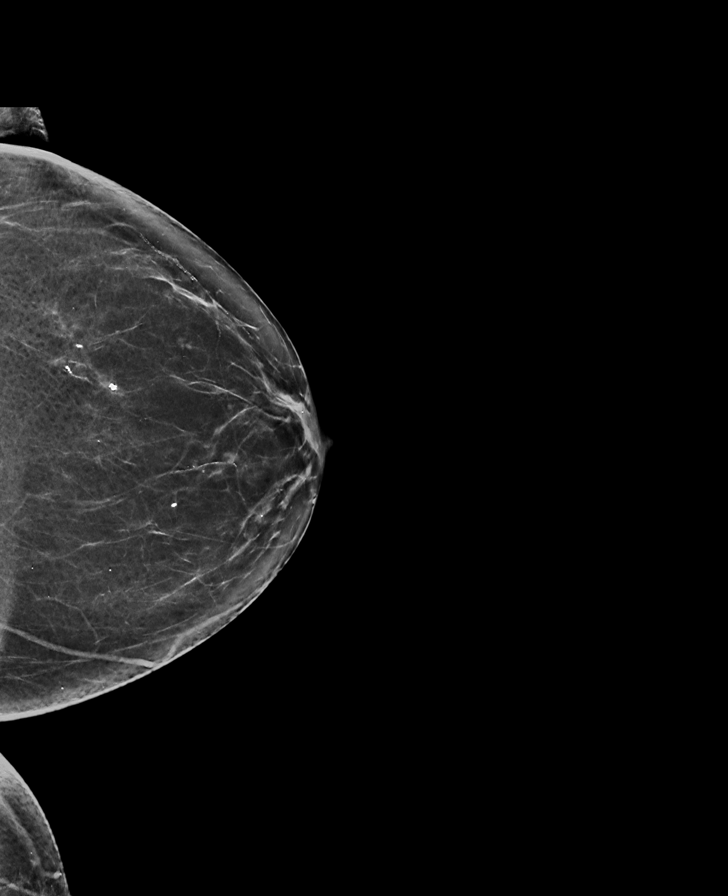

[L MLO]
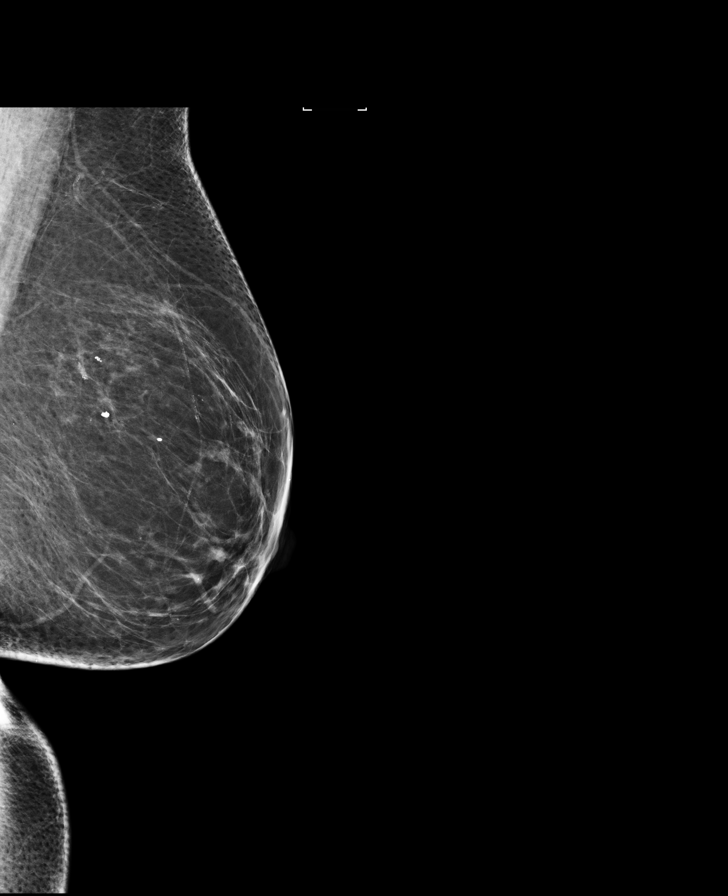

[R MLO synth-2D]
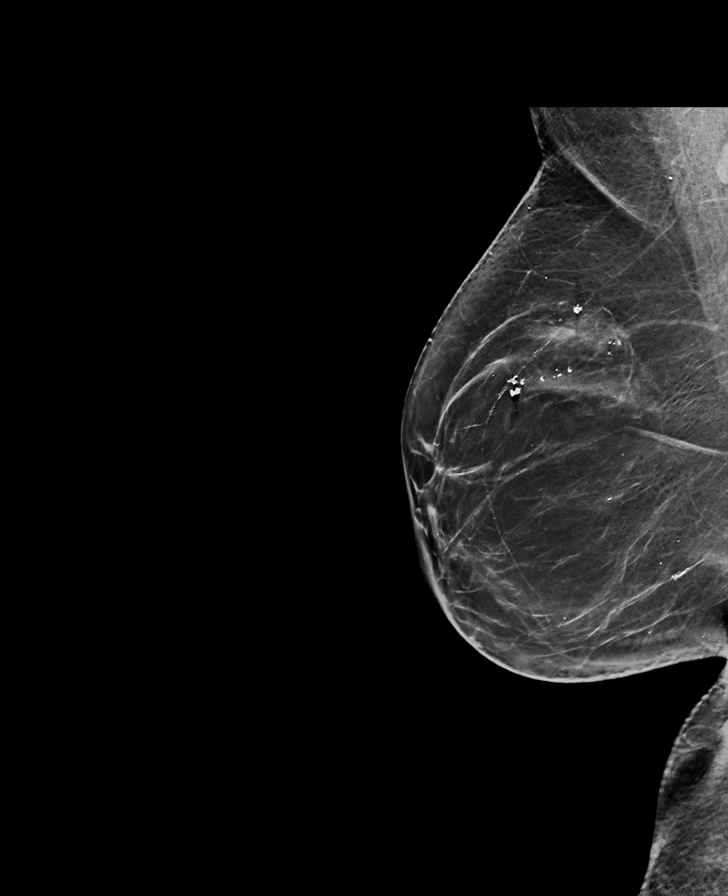

[R CC synth-2D]
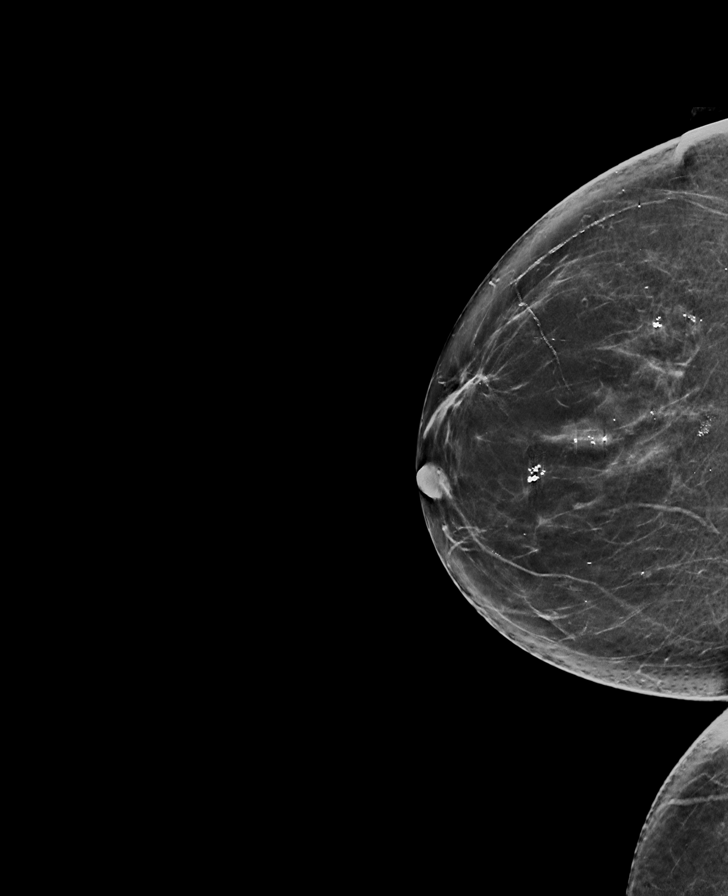

[8 of 29 positions shown; findings below may reference images not displayed]

ACR Breast Density Category b: There are scattered areas of
fibroglandular density.
FINDINGS: There are no findings suspicious for malignancy. Images were
processed with CAD.
IMPRESSION: No mammographic evidence of malignancy. A result letter of this
screening mammogram will be mailed directly to the patient.

RECOMMENDATION:
Screening mammogram in one year. (Code:97-6-RS4)

BI-RADS CATEGORY  1: Negative.

## 2016-12-15 ENCOUNTER — Other Ambulatory Visit: Payer: Self-pay | Admitting: Obstetrics and Gynecology

## 2016-12-15 DIAGNOSIS — Z1231 Encounter for screening mammogram for malignant neoplasm of breast: Secondary | ICD-10-CM

## 2017-02-01 ENCOUNTER — Ambulatory Visit
Admission: RE | Admit: 2017-02-01 | Discharge: 2017-02-01 | Disposition: A | Discharge status: Home / Self Care | Payer: Medicare Other | Source: Ambulatory Visit | Attending: Obstetrics and Gynecology | Admitting: Obstetrics and Gynecology

## 2017-02-01 DIAGNOSIS — Z1231 Encounter for screening mammogram for malignant neoplasm of breast: Secondary | ICD-10-CM

## 2017-02-03 ENCOUNTER — Ambulatory Visit (INDEPENDENT_AMBULATORY_CARE_PROVIDER_SITE_OTHER): Payer: Medicare Other | Admitting: Podiatry

## 2017-02-03 ENCOUNTER — Encounter: Payer: Self-pay | Admitting: Podiatry

## 2017-02-03 DIAGNOSIS — M722 Plantar fascial fibromatosis: Secondary | ICD-10-CM | POA: Diagnosis not present

## 2017-02-03 DIAGNOSIS — M21619 Bunion of unspecified foot: Secondary | ICD-10-CM

## 2017-02-03 MED ORDER — TRIAMCINOLONE ACETONIDE 10 MG/ML IJ SUSP
10.0000 mg | Freq: Once | INTRAMUSCULAR | Status: AC
  Administered 2017-02-03: 10 mg

## 2017-02-03 NOTE — Progress Notes (Signed)
Subjective:    Patient ID: Whitney [name], female   DOB: 76 y.o.   MRN: [id number]   HPI patient states the pain is not as intense but it still bothering her on the plantar aspect of her left heel    ROS      Objective:  Physical Exam neurovascular status intact with continued discomfort plantar fascial left at the insertional point tendon into the calcaneus with inflammation fluid around the medial band     Assessment:    Continue plantar fasciitis left     Plan:  Reinjected the plantar fascial left 3 mg Kenalog 5 mill grams Xylocaine and instructed on physical therapy anti-inflammatories night splint usage and reappoint 3 months and may need chronic treatment for condition

## 2017-05-06 ENCOUNTER — Encounter: Payer: Self-pay | Admitting: Podiatry

## 2017-05-06 ENCOUNTER — Ambulatory Visit (INDEPENDENT_AMBULATORY_CARE_PROVIDER_SITE_OTHER): Payer: Medicare Other | Admitting: Podiatry

## 2017-05-06 DIAGNOSIS — M722 Plantar fascial fibromatosis: Secondary | ICD-10-CM | POA: Diagnosis not present

## 2017-05-06 NOTE — Progress Notes (Signed)
Subjective:    Patient ID: Whitney [name], female   DOB: 76 y.o.   MRN: [id number]   HPI patient states she's feeling much better currently    ROS      Objective:  Physical Exam neurovascular status intact with patient's left heel doing much better with diminished discomfort with mild swelling but significant reduction of pain     Assessment:    Doing well post plantar fascial procedure left     Plan:     Recommended the continuation of wider-type shoes with elevated heels and compression and continue offloading the area. Reappoint to recheck as needed

## 2017-05-11 ENCOUNTER — Emergency Department: Payer: Medicare Other

## 2017-05-11 ENCOUNTER — Encounter: Payer: Self-pay | Admitting: *Deleted

## 2017-05-11 ENCOUNTER — Emergency Department
Admission: EM | Admit: 2017-05-11 | Discharge: 2017-05-12 | Disposition: A | Discharge status: Home / Self Care | Payer: Medicare Other | Attending: Emergency Medicine | Admitting: Emergency Medicine

## 2017-05-11 DIAGNOSIS — Z87891 Personal history of nicotine dependence: Secondary | ICD-10-CM | POA: Diagnosis not present

## 2017-05-11 DIAGNOSIS — Z7982 Long term (current) use of aspirin: Secondary | ICD-10-CM | POA: Diagnosis not present

## 2017-05-11 DIAGNOSIS — I10 Essential (primary) hypertension: Secondary | ICD-10-CM | POA: Insufficient documentation

## 2017-05-11 DIAGNOSIS — Z7984 Long term (current) use of oral hypoglycemic drugs: Secondary | ICD-10-CM | POA: Diagnosis not present

## 2017-05-11 DIAGNOSIS — Z79899 Other long term (current) drug therapy: Secondary | ICD-10-CM | POA: Diagnosis not present

## 2017-05-11 DIAGNOSIS — R1031 Right lower quadrant pain: Secondary | ICD-10-CM | POA: Diagnosis present

## 2017-05-11 DIAGNOSIS — E119 Type 2 diabetes mellitus without complications: Secondary | ICD-10-CM | POA: Insufficient documentation

## 2017-05-11 DIAGNOSIS — R1011 Right upper quadrant pain: Secondary | ICD-10-CM | POA: Diagnosis not present

## 2017-05-11 HISTORY — DX: Malignant (primary) neoplasm, unspecified: C80.1

## 2017-05-11 LAB — COMPREHENSIVE METABOLIC PANEL
ALBUMIN: 3.8 g/dL (ref 3.5–5.0)
ALT: 15 U/L (ref 14–54)
ANION GAP: 12 (ref 5–15)
AST: 32 U/L (ref 15–41)
Alkaline Phosphatase: 76 U/L (ref 38–126)
BILIRUBIN TOTAL: 0.7 mg/dL (ref 0.3–1.2)
BUN: 9 mg/dL (ref 6–20)
CO2: 21 mmol/L — ABNORMAL LOW (ref 22–32)
Calcium: 9 mg/dL (ref 8.9–10.3)
Chloride: 102 mmol/L (ref 101–111)
Creatinine, Ser: 0.61 mg/dL (ref 0.44–1.00)
GFR calc Af Amer: >60 mL/min (ref >60)
GFR calc non Af Amer: >60 mL/min (ref >60)
GLUCOSE: 186 mg/dL — AB (ref 65–99)
POTASSIUM: 3.7 mmol/L (ref 3.5–5.1)
Sodium: 135 mmol/L (ref 135–145)
TOTAL PROTEIN: 7.6 g/dL (ref 6.5–8.1)

## 2017-05-11 LAB — CBC
HEMATOCRIT: 32.5 % — AB (ref 35.0–47.0)
HEMOGLOBIN: 10.8 g/dL — AB (ref 12.0–16.0)
MCH: 28.2 pg (ref 26.0–34.0)
MCHC: 33.2 g/dL (ref 32.0–36.0)
MCV: 84.8 fL (ref 80.0–100.0)
Platelets: 179 10*3/uL (ref 150–440)
RBC: 3.83 MIL/uL (ref 3.80–5.20)
RDW: 14.3 % (ref 11.5–14.5)
WBC: 7.2 10*3/uL (ref 3.6–11.0)

## 2017-05-11 LAB — LIPASE, BLOOD: Lipase: 28 U/L (ref 11–51)

## 2017-05-11 MED ORDER — DIPHENHYDRAMINE HCL 25 MG PO CAPS
50.0000 mg | ORAL_CAPSULE | Freq: Once | ORAL | Status: AC
  Administered 2017-05-12: 50 mg via ORAL
  Filled 2017-05-11: qty 2

## 2017-05-11 MED ORDER — HYDROCORTISONE NA SUCCINATE PF 250 MG IJ SOLR
200.0000 mg | Freq: Once | INTRAMUSCULAR | Status: AC
  Administered 2017-05-11: 200 mg via INTRAVENOUS
  Filled 2017-05-11: qty 200

## 2017-05-11 MED ORDER — FENTANYL CITRATE (PF) 100 MCG/2ML IJ SOLN
50.0000 ug | Freq: Once | INTRAMUSCULAR | Status: AC
  Administered 2017-05-11: 50 ug via INTRAVENOUS
  Filled 2017-05-11: qty 2

## 2017-05-11 MED ORDER — DIPHENHYDRAMINE HCL 50 MG/ML IJ SOLN: 50.0000 mg | Freq: Once | INTRAMUSCULAR | Status: AC

## 2017-05-11 MED ORDER — SODIUM CHLORIDE 0.9 % IV BOLUS (SEPSIS)
1000.0000 mL | Freq: Once | INTRAVENOUS | Status: AC
  Administered 2017-05-11: 1000 mL via INTRAVENOUS

## 2017-05-11 NOTE — ED Notes (Signed)
Patient taken to ultrasound.

## 2017-05-11 NOTE — ED Notes (Signed)
Patient was concerned if she needed the CT scan if the ultrasound was negative. Dr. Mariea Clonts informed who states she needs both.

## 2017-05-11 NOTE — ED Notes (Signed)
Pharmacy called for med  

## 2017-05-11 NOTE — ED Provider Notes (Signed)
[facility] Emergency Department Provider Note  ____________________________________________  Time seen: Approximately 5:45 PM  I have reviewed the triage vital signs and the nursing notes.   HISTORY  Chief Complaint Abdominal Pain   HPI Whitney [name] is a 76 y.o. female with a history of NASH cirrhosis, type 2 diabetes and hypertension who presents for evaluation of abdominal pain. Patient reports that the pain started this morning, the pain is grabbing/sharp in nature, located in the right lower quadrant, worse with movement or with coughing and improved since patient lay still. Currently 8 out of 10. She has had no nausea, no vomiting, no fever, no chills, no dysuria, no hematuria, no diarrhea or constipation.patient denies ever having similar pain in the past. Patient reports that she has had a and partial colectomy several years ago for colon cancer and was told by the surgeon that her appendix was removed at the same time. She also had cholecystectomy several years ago. Of note patient had an EGD done 4 days ago but didn't have any pain until this morning.  Past Medical History:  Diagnosis Date  . Allergy   . Diabetes mellitus without complication ([place])   . Hypertension     Patient Active Problem List   Diagnosis Date Noted  . 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 BREAST EXCISIONAL BIOPSY Bilateral 60's, 70's, 80's   Benign    Prior to Admission medications   Medication Sig Start Date End Date Taking? Authorizing Provider  Acetaminophen 500 MG coapsule Take by mouth.    [provider]  amLODipine (NORVASC) 5 MG tablet Take by mouth. Reported on 07/18/2015    [provider]  aspirin EC 81 MG tablet Take by mouth.    [provider]  Calcium Citrate-Vitamin D (CITRACAL MAXIMUM) 315-250 MG-UNIT TABS Take by mouth. 10/15/10   [provider]  carvedilol (COREG)  12.5 MG tablet Take by mouth. 10/09/09   [provider]  Cholecalciferol (VITAMIN D3) 2000 UNITS capsule Take by mouth.    [provider]  ferrous sulfate 325 (65 FE) MG tablet Take by mouth.    [provider]  fexofenadine (ALLEGRA) 30 MG tablet Take 30 mg by mouth 2 (two) times daily.    [provider]  fluticasone (FLONASE) 50 MCG/ACT nasal spray Place into the nose. 10/15/10   [provider]  linagliptin (TRADJENTA) 5 MG TABS tablet Take by mouth.    [provider]  losartan (COZAAR) 50 MG tablet Take by mouth. 06/14/07   [provider]  pantoprazole (PROTONIX) 20 MG tablet Take 20 mg by mouth daily.    [provider]  spironolactone (ALDACTONE) 25 MG tablet Take by mouth. Reported on 07/18/2015    [provider]    Allergies Lactose; Oxycodone; Epinephrine; Ibuprofen; Iohexol; Penicillins; Propoxyphene n-acetaminophen; Hydrocodone; and Sulfa antibiotics  Family History  Problem Relation Age of Onset  . Breast cancer Neg Hx     Social History Social History   Tobacco Use  . Smoking status: Former Smoker    Types: Cigarettes    Last attempt to quit: 07/18/1975    Years since quitting: 41.8  . Smokeless tobacco: Never Used  Substance Use Topics  . Alcohol use: Yes  . Drug use: No    Review of Systems  Constitutional: Negative for fever. Eyes: Negative for visual changes. ENT: Negative for sore throat. Neck: No neck pain  Cardiovascular: Negative for chest pain. Respiratory: Negative for shortness of breath. Gastrointestinal: + RLQ abdominal pain. No vomiting or diarrhea. Genitourinary: Negative for dysuria. Musculoskeletal: Negative for back pain. Skin: Negative for rash. Neurological: Negative for headaches, weakness or numbness. Psych: No SI or HI  ____________________________________________   PHYSICAL EXAM:  VITAL SIGNS: ED Triage Vitals [05/11/17 1448]  Enc Vitals Group      BP 134/61     Pulse Rate 64     Resp 18     Temp 99.1 F (37.3 C)     Temp Source Oral     SpO2 96 %     Weight 149 lb (67.6 kg)     Height 5\' 5"  (1.651 m)     Head Circumference      Peak Flow      Pain Score 3     Pain Loc      Pain Edu?      Excl. in [place]?     Constitutional: Alert and oriented. Well appearing and in no apparent distress. HEENT:      Head: Normocephalic and atraumatic.         Eyes: Conjunctivae are normal. Sclera is non-icteric.       Mouth/Throat: Mucous membranes are moist.       Neck: Supple with no signs of meningismus. Cardiovascular: Regular rate and rhythm. No murmurs, gallops, or rubs. 2+ symmetrical distal pulses are present in all extremities. No JVD. Respiratory: Normal respiratory effort. Lungs are clear to auscultation bilaterally. No wheezes, crackles, or rhonchi.  Gastrointestinal: Soft, ttp over the RLQ with localized guarding and positive Rovsing sign, and non distended with positive bowel sounds. No rebound or guarding. Genitourinary: No CVA tenderness. Musculoskeletal: Nontender with normal range of motion in all extremities. No edema, cyanosis, or erythema of extremities. Neurologic: Normal speech and language. Face is symmetric. Moving all extremities. No gross focal neurologic deficits are appreciated. Skin: Skin is warm, dry and intact. No rash noted. Psychiatric: Mood and affect are normal. Speech and behavior are normal.  ____________________________________________   LABS (all labs ordered are listed, but only abnormal results are displayed)  Labs Reviewed  COMPREHENSIVE METABOLIC PANEL - Abnormal; Notable for the following components:      Result Value   CO2 21 (*)    Glucose, Bld 186 (*)    All other components within normal limits  CBC - Abnormal; Notable for the following components:   Hemoglobin 10.8 (*)    HCT 32.5 (*)    All other components within normal limits  LIPASE, BLOOD  URINALYSIS, COMPLETE (UACMP) WITH  MICROSCOPIC   ____________________________________________  EKG  none  ____________________________________________  RADIOLOGY  CT a/p: Heterogeneously enlarged cirrhotic liver. Scattered subcentimeter liver lesions. Although no dominant mass is identified on this unenhanced exam, dedicated contrast enhanced MR (preferably) or CT would be necessary to exclude dysplastic nodules or early the hepatocellular carcinoma.  Portacaval, porta hepatis and peripancreatic adenopathy.  Prior right partial colectomy and I suspect removal of the appendix. No extraluminal bowel inflammatory process noted.  No obstructing stone or hydronephrosis.  Contracted urinary bladder with circumferential wall thickening without polypoid lesion.  Post hysterectomy and cholecystectomy.  Aortic Atherosclerosis (ICD10-I70.0).  Prominent vascular calcifications throughout all visualized arterial structures. ____________________________________________   PROCEDURES  Procedure(s) performed: None Procedures Critical Care performed:  None ____________________________________________   INITIAL IMPRESSION / ASSESSMENT AND PLAN / ED COURSE  76 y.o. female with a history of NASH cirrhosis, type 2 diabetes and hypertension  who presents for evaluation of RLQ abdominal pain since this am. Patient is well appearing, no distress, normal VS, abdomen is soft and ttp over the RLQ with localized guarding and positive Rovsing sign. Patient is not certain her appendix has been removed but remembers being told that it was removed when she had a partial bowel resection for colon cancer several years ago. Labs showing normal WBC, normal CMP and lipase other slightly elevated BG at 186. UA and CT a/p pending. Ddx appendicitis vs diverticulitis vs kidney stone vs malignancy vs ovarian pathology.  _________________________ 7:46 PM on 05/11/2017 -----------------------------------------  CT w/o contrast showing scattered  subcentimeter liver lesions. Also significant vascular calcifications. Patient tells me she has a history of fibromuscular dysplasia and with otherwise no alternative diagnosis at this time with patient requiring multiple rounds of narcotics, I do feel patient needs to undergo CT angio of her abdomen to rule out ischemia. Will initiate emergent prep for CT with contrast for history of rash. Re-evaluation shows that patient is now tender mostly on the RUQ. Therefore iIn the meantime will send patient for [place] to rule out portal venous thrombosis as a possible diagnosis. Care transferred to Dr. Mariea Clonts   As part of my medical decision making, I reviewed the following data within the [place] notes reviewed and incorporated, Labs reviewed , Old chart reviewed, Patient signed out to Dr. Mariea Clonts, Radiograph reviewed , Notes from prior ED visits and [place] Controlled Substance Database    Pertinent labs & imaging results that were available during my care of the patient were reviewed by me and considered in my medical decision making (see chart for details).    ____________________________________________   FINAL CLINICAL IMPRESSION(S) / ED DIAGNOSES  Final diagnoses:  RUQ abdominal pain      NEW MEDICATIONS STARTED DURING THIS VISIT:  This SmartLink is deprecated. Use AVSMEDLIST instead to display the medication list for a patient.   Note:  This document was prepared using Dragon voice recognition software and may include unintentional dictation errors.    Rudene Re, MD 05/12/17 [phone]

## 2017-05-11 NOTE — ED Triage Notes (Signed)
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

## 2017-05-11 NOTE — ED Notes (Signed)
Pt brought over from [place] via RN in wheelchair.  NAD noted at this time.

## 2017-05-12 ENCOUNTER — Emergency Department: Payer: Medicare Other

## 2017-05-12 DIAGNOSIS — R1011 Right upper quadrant pain: Secondary | ICD-10-CM | POA: Diagnosis not present

## 2017-05-12 LAB — URINALYSIS, COMPLETE (UACMP) WITH MICROSCOPIC
BACTERIA UA: NONE SEEN
BILIRUBIN URINE: NEGATIVE
Glucose, UA: 50 mg/dL — AB
HGB URINE DIPSTICK: NEGATIVE
KETONES UR: NEGATIVE mg/dL
LEUKOCYTES UA: NEGATIVE
NITRITE: NEGATIVE
PH: 7 (ref 5.0–8.0)
Protein, ur: NEGATIVE mg/dL
RBC / HPF: NONE SEEN RBC/hpf (ref 0–5)
Specific Gravity, Urine: 1.044 — ABNORMAL HIGH (ref 1.005–1.030)
WBC, UA: NONE SEEN WBC/hpf (ref 0–5)

## 2017-05-12 MED ORDER — DICYCLOMINE HCL 20 MG PO TABS: 20.0000 mg | ORAL_TABLET | Freq: Four times a day (QID) | ORAL | 0 refills | Status: DC | PRN

## 2017-05-12 MED ORDER — ONDANSETRON 4 MG PO TBDP: 4.0000 mg | ORAL_TABLET | Freq: Three times a day (TID) | ORAL | 0 refills | Status: DC | PRN

## 2017-05-12 MED ORDER — IOPAMIDOL (ISOVUE-370) INJECTION 76%
125.0000 mL | Freq: Once | INTRAVENOUS | Status: AC | PRN
  Administered 2017-05-12: 125 mL via INTRAVENOUS

## 2017-05-12 MED ORDER — GABAPENTIN 300 MG PO CAPS: 300.0000 mg | ORAL_CAPSULE | Freq: Three times a day (TID) | ORAL | 0 refills | Status: DC | PRN

## 2017-05-12 NOTE — Discharge Instructions (Signed)
Your CT scan showed some lymph nodes which may be enlarged due to your cirrhosis. You may take medicines as needed for abdominal discomfort and nausea (Bentyl/Zofran #20). Since you had good relief of symptoms before with gabapentin, I have written a prescription. Return to the ER for worsening symptoms, persistent vomiting, difficulty breathing or other concerns.

## 2017-05-12 NOTE — ED Notes (Signed)
Assisted pt to the restroom. No other needs voiced at this time.

## 2017-05-12 NOTE — ED Notes (Signed)
CT notified of premeds given.

## 2017-07-08 ENCOUNTER — Ambulatory Visit: Payer: Medicare Other | Admitting: Podiatry

## 2018-01-10 ENCOUNTER — Other Ambulatory Visit: Payer: Self-pay | Admitting: Obstetrics and Gynecology

## 2018-01-10 DIAGNOSIS — Z1231 Encounter for screening mammogram for malignant neoplasm of breast: Secondary | ICD-10-CM

## 2018-02-02 ENCOUNTER — Ambulatory Visit
Admission: RE | Admit: 2018-02-02 | Discharge: 2018-02-02 | Disposition: A | Discharge status: Home / Self Care | Payer: Medicare Other | Source: Ambulatory Visit | Attending: Obstetrics and Gynecology | Admitting: Obstetrics and Gynecology

## 2018-02-02 DIAGNOSIS — Z1231 Encounter for screening mammogram for malignant neoplasm of breast: Secondary | ICD-10-CM | POA: Diagnosis present

## 2018-06-13 ENCOUNTER — Ambulatory Visit: Payer: Medicare Other | Admitting: Podiatry

## 2018-07-25 DIAGNOSIS — M189 Osteoarthritis of first carpometacarpal joint, unspecified: Secondary | ICD-10-CM | POA: Insufficient documentation

## 2018-12-16 DIAGNOSIS — M545 Low back pain, unspecified: Secondary | ICD-10-CM | POA: Insufficient documentation

## 2018-12-16 DIAGNOSIS — G8929 Other chronic pain: Secondary | ICD-10-CM | POA: Insufficient documentation

## 2018-12-21 ENCOUNTER — Ambulatory Visit (INDEPENDENT_AMBULATORY_CARE_PROVIDER_SITE_OTHER): Payer: Medicare Other

## 2018-12-21 ENCOUNTER — Other Ambulatory Visit: Payer: Self-pay

## 2018-12-21 ENCOUNTER — Telehealth: Payer: Self-pay | Admitting: *Deleted

## 2018-12-21 ENCOUNTER — Ambulatory Visit (INDEPENDENT_AMBULATORY_CARE_PROVIDER_SITE_OTHER): Payer: Medicare Other | Admitting: Physical Medicine and Rehabilitation

## 2018-12-21 ENCOUNTER — Encounter: Payer: Self-pay | Admitting: Physical Medicine and Rehabilitation

## 2018-12-21 VITALS — BP 131/66 | HR 62 | Ht 65.5 in | Wt 149.0 lb

## 2018-12-21 DIAGNOSIS — M25512 Pain in left shoulder: Secondary | ICD-10-CM | POA: Diagnosis not present

## 2018-12-21 DIAGNOSIS — M25551 Pain in right hip: Secondary | ICD-10-CM | POA: Diagnosis not present

## 2018-12-21 DIAGNOSIS — M545 Low back pain, unspecified: Secondary | ICD-10-CM

## 2018-12-21 DIAGNOSIS — M5416 Radiculopathy, lumbar region: Secondary | ICD-10-CM | POA: Diagnosis not present

## 2018-12-21 DIAGNOSIS — M419 Scoliosis, unspecified: Secondary | ICD-10-CM

## 2018-12-21 DIAGNOSIS — F411 Generalized anxiety disorder: Secondary | ICD-10-CM

## 2018-12-21 MED ORDER — DIAZEPAM 5 MG PO TABS: ORAL_TABLET | ORAL | 0 refills | Status: DC

## 2018-12-21 NOTE — Progress Notes (Signed)
 .  Numeric Pain Rating Scale and Functional Assessment Average Pain 6 Pain Right Now 4 My pain is constant, dull and aching Pain is worse with: bending and some activites Pain improves with: medication   In the last MONTH (on 0-10 scale) has pain interfered with the following?  1. General activity like being  able to carry out your everyday physical activities such as walking, climbing stairs, carrying groceries, or moving a chair?  Rating(6)  2. Relation with others like being able to carry out your usual social activities and roles such as  activities at home, at work and in your community. Rating(6)  3. Enjoyment of life such that you have  been bothered by emotional problems such as feeling anxious, depressed or irritable?  Rating(2)

## 2018-12-21 NOTE — Telephone Encounter (Signed)
Double check for me but I think I did this, this morning

## 2018-12-21 NOTE — Progress Notes (Signed)
Whitney [name] - 78 y.o. female MRN [id number]  Date of birth: 1940-09-08  Office Visit Note: Visit Date: 12/21/2018 PCP: Dian Queen, MD Referred by: Dian Queen, MD  Subjective: Chief Complaint  Patient presents with   Lower Back - Pain   Right Hip - Pain   Left Shoulder - Pain   HPI: Whitney [name] is a 78 y.o. female who comes in today As a self-referral for evaluation and management of really a multitude of painful areas including her left shoulder and lower back in particular.  She has had a chronic history of many years of back pain.  She reports a history of scoliosis.  She has been followed by Dr. Suella Broad at [facility].  She reports being a little bit unhappy there and would like to see if we could help her in terms of her musculoskeletal complaints.  In terms of her back pain its mostly on the right side with some referral to the buttocks and hip with some groin pain.  Pain over the bursa.  She has had several injections over the bursa with some relief.  She feels like she may have had a hip injection by Dr. Nelva Bush.  She says she has gotten relief with those.  They will last for a while.  She has had MRI of the lumbar spine through them but we do not have that for review today.  We do have an old MRI from 2008 by report that shows minimal lumbar spine problems other than arthritis.  She does not have any radicular pain past the knee or paresthesia.  She has been using Robaxin and gabapentin and Tylenol for pain relief.  She reports her average pain is a 6 out of 10.  This is constant dull and aching pain worse with bending and standing.  It does limit her daily activities.  She has not had any focal weakness or bowel or bladder issues.  No prior lumbar surgery.  Her other problem today is her left shoulder.  This is also been a chronic problem and she has had injections in the past in the office.  These seem to have been subacromial injections.  We did obtain lumbar  spine x-ray imaging today and those are reviewed below.  Review of Systems  Constitutional: Negative for chills, fever, malaise/fatigue and weight loss.  HENT: Negative for hearing loss and sinus pain.   Eyes: Negative for blurred vision, double vision and photophobia.  Respiratory: Negative for cough and shortness of breath.   Cardiovascular: Negative for chest pain, palpitations and leg swelling.  Gastrointestinal: Negative for abdominal pain, nausea and vomiting.  Genitourinary: Negative for flank pain.  Musculoskeletal: Positive for back pain and joint pain. Negative for myalgias.  Skin: Negative for itching and rash.  Neurological: Negative for tremors, focal weakness and weakness.  Endo/Heme/Allergies: Negative.   Psychiatric/Behavioral: Negative for depression.  All other systems reviewed and are negative.  Otherwise per HPI.  Assessment & Plan: Visit Diagnoses:  1. Left shoulder pain, unspecified chronicity   2. Acute right-sided low back pain without sciatica   3. Pain in right hip   4. Lumbar radiculopathy   5. Scoliosis of thoracolumbar spine, unspecified scoliosis type   6. Pre-operative anxiety     Plan: Findings:  1.  In terms of her back and hip pain I think this is multifactorial arthritic pain of the lumbar spine in general.  She could be getting some referral pain from the facet joints.  She does have some pain over the greater trochanter and some pain at end ranges of hip rotation.  Not really able to reproduce the groin pain to a degree.  She has good strength otherwise.  We will get notes from Dr. Herma Mering and her MRI.  Would consider facet joint block or epidural injection.  Would consider diagnostic intra-articular hip injection.  She will continue with current medications.  She does not do well with opioid medication including oxycodone and hydrocodone as well as a prior history of problems with propoxyphene which is no longer on the market.  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  2.  In terms of her left shoulder she does have some impingement signs.  I think this is subacromial bursitis.  Depending on results with injection which was performed the day may have her seen in the office by Dr. Anderson Malta or she can continue to see the orthopedic surgeons at [facility].  Focused physical therapy which she has had in the past could also be considered.  There are some ideas of been exercise program she can follow-up for her shoulder.    Meds & Orders:  Meds ordered this encounter  Medications   diazepam (VALIUM) 5 MG tablet    Sig: Take 1 by mouth 1 hour  pre-procedure with very light food. May bring 2nd tablet to appointment.    Dispense:  2 tablet    Refill:  0    Orders Placed This Encounter  Procedures   Large Joint Inj: L subacromial bursa   XR Lumbar Spine 2-3 Views   XR Shoulder Left    Follow-up: Return if symptoms worsen or fail to improve.   Procedures: Large Joint Inj: L subacromial bursa on 12/21/2018 12:57 PM Indications: pain and diagnostic evaluation Details: 25 G 1.5 in needle, posterior approach  Arthrogram: No  Medications: 40 mg triamcinolone acetonide 40 MG/ML; 4 mL bupivacaine 0.25 % Outcome: tolerated well, no immediate complications  Injectate delivered freely without restriction through the normal position. Patient seemed to have relief during the anesthetic phase.  Procedure, treatment alternatives, risks and benefits explained, specific risks discussed. Consent was given by the patient. Immediately prior to procedure a time out was called to verify the correct patient, procedure, equipment, support staff and site/side marked as required. Patient was prepped and draped in the usual sterile fashion.      No notes on file   Clinical History: PROCEDURE:     MR  - MR LUMBAR SPINE WO CONTRAST  - Apr 14 2007  5:24PM   RESULT:     Multiplanar/multisequence imaging of the lumbar spine is    obtained without the administration of gadolinium.   The conus medullaris terminates at an L1 level. The cauda equina  demonstrate  no evidence of clumping or thickening.   At the T11-12, T12-L1 levels there is no evidence of thecal sac stenosis  or  exiting nerve root compression or compromise.  There is a questionable  area  of thecal sac stenosis at the T10 level. This may be volume averaging  though  if the patient has persistent complaints of pain and/or clinical concern  further evaluation with MRI of the thoracic spine is recommended.   At the L1-2 level a mild broad-based disc bulge is appreciated. There is  minimal effacement of the anterior CSF space without evidence of exiting  nerve root compression or compromise.   At the  L2-L3, L3-4, L4-5, and L5-1 levels there is no evidence of thecal  sac  stenosis, neural foraminal narrowing or exiting nerve root compression or  compromise.   IMPRESSION:   1.Mild broad-based disc bulge at the L1-L2 level with resulting mild  thecal  sac narrowing. There does not appear to be evidence of exiting nerve root  compression or compromise.  2.Findings are incompletely evaluated at the T10 level which may simply  represent volume averaging though an area of thecal sac stenosis cannot be  excluded and if the patient has persistent pain or if there is clinical  concern, further evaluation with thoracic MRI is recommended.   She reports that she quit smoking about 43 years ago. Her smoking use included cigarettes. She has never used smokeless tobacco. No results for input(s): HGBA1C, LABURIC in the last 8760 hours.  Objective:  VS:  HT:5' 5.5" (166.4 cm)    WT:149 lb (67.6 kg)   BMI:24.41     BP:131/66   HR:62bpm   TEMP: ( )   RESP:  Physical Exam Vitals signs and nursing note reviewed.  Constitutional:      General: She is not in acute distress.    Appearance: Normal appearance. She is well-developed.  HENT:     Head:  Normocephalic and atraumatic.     Nose: Nose normal.     Mouth/Throat:     Mouth: Mucous membranes are moist.     Pharynx: Oropharynx is clear.  Eyes:     Conjunctiva/sclera: Conjunctivae normal.     Pupils: Pupils are equal, round, and reactive to light.  Neck:     Musculoskeletal: Normal range of motion and neck supple.  Cardiovascular:     Rate and Rhythm: Regular rhythm.  Pulmonary:     Effort: Pulmonary effort is normal. No respiratory distress.  Abdominal:     General: There is no distension.     Palpations: Abdomen is soft.     Tenderness: There is no guarding.  Musculoskeletal:     Right lower leg: No edema.     Left lower leg: No edema.     Comments: Cervical spine exam shows forward flexed cervical spine with normal range of motion and some active trigger points in the levator scapula and rhomboid.  She does have some impingement of the left shoulder with external rotation.  She has good strength in the hands.  Examination of the lumbar spine shows patient with pain with extension and facet loading which is consistent with her back pain.  Some pain over the right greater trochanter more than left.  Pain at end ranges of rotation but hard to reproduce the groin pain.  Good distal strength without clonus.  Skin:    General: Skin is warm and dry.     Findings: No erythema or rash.  Neurological:     General: No focal deficit present.     Mental Status: She is alert and oriented to person, place, and time.     Motor: No abnormal muscle tone.     Coordination: Coordination normal.     Gait: Gait normal.  Psychiatric:        Mood and Affect: Mood normal.        Behavior: Behavior normal.        Thought Content: Thought content normal.     Ortho Exam Imaging: 3 views left shoulder:3 views of the left glenohumeral joint shows fairly normal-appearing joint fairly normal-appearing acromion mild AC joint arthritic change. 2  view AP lateral lumbar spine:AP and lateral lumbar  spine shows rightward lumbar scoliosis centered at L1-2 some rotation and there is facet arthropathy particular at L3-4 and L4-5 with small retrolisthesis of L3 on L4.  Good disc heights throughout.  Past Medical/Family/Surgical/Social History: Medications & Allergies reviewed per EMR, new medications updated. Patient Active Problem List   Diagnosis Date Noted   VULVAR VESTIBULITIS 07/30/2009   Past Medical History:  Diagnosis Date   Allergy    Cancer [facility]) 2007   Colon Cancer stage 1  surgery only.  no chemo or xrt.   Diabetes mellitus without complication ([place])    Hypertension    Family History  Problem Relation Age of Onset   Breast cancer Neg Hx    Past Surgical History:  Procedure Laterality Date   BREAST EXCISIONAL BIOPSY Bilateral 60's, 70's, 80's   Benign   Social History   Occupational History   Not on file  Tobacco Use   Smoking status: Former Smoker    Types: Cigarettes    Quit date: 07/18/1975    Years since quitting: 43.5   Smokeless tobacco: Never Used  Substance and Sexual Activity   Alcohol use: Yes   Drug use: No   Sexual activity: Not on file

## 2018-12-22 NOTE — Telephone Encounter (Signed)
Called pt and advised.  

## 2018-12-26 ENCOUNTER — Other Ambulatory Visit: Payer: Self-pay | Admitting: Obstetrics and Gynecology

## 2019-01-02 ENCOUNTER — Other Ambulatory Visit: Payer: Self-pay

## 2019-01-02 ENCOUNTER — Ambulatory Visit: Payer: Medicare Other

## 2019-01-02 ENCOUNTER — Ambulatory Visit (INDEPENDENT_AMBULATORY_CARE_PROVIDER_SITE_OTHER): Payer: Medicare Other | Admitting: Physical Medicine and Rehabilitation

## 2019-01-02 DIAGNOSIS — M25551 Pain in right hip: Secondary | ICD-10-CM | POA: Diagnosis not present

## 2019-01-02 MED ORDER — TRIAMCINOLONE ACETONIDE 40 MG/ML IJ SUSP
80.0000 mg | INTRAMUSCULAR | Status: AC | PRN
  Administered 2019-01-02: 80 mg via INTRA_ARTICULAR

## 2019-01-02 MED ORDER — BUPIVACAINE HCL 0.25 % IJ SOLN
4.0000 mL | INTRAMUSCULAR | Status: AC | PRN
  Administered 2019-01-02: 4 mL via INTRA_ARTICULAR

## 2019-01-02 NOTE — Progress Notes (Signed)
Numeric Pain Rating Scale and Functional Assessment Average Pain 6   In the last MONTH (on 0-10 scale) has pain interfered with the following?  1. General activity like being  able to carry out your everyday physical activities such as walking, climbing stairs, carrying groceries, or moving a chair?  Rating(5)   +Driver, -BT, -Dye Allergies. **took a Valium for procedure

## 2019-01-02 NOTE — Progress Notes (Signed)
   Whitney [name] - 78 y.o. female MRN [id number]  Date of birth: 1940/08/06  Office Visit Note: Visit Date: 01/02/2019 PCP: Dian Queen, MD Referred by: Dian Queen, MD  Subjective: Chief Complaint  Patient presents with  . Right Hip - Follow-up    Here for right hip injection   HPI:  Whitney [name] is a 78 y.o. female who comes in today For planned intra-articular right sided anesthetic hip arthrogram.  This would be diagnostic and hopefully therapeutic for her right hip pain.  She reports again right hip pain which is anterior lateral into the groin at times and posterior.  Nothing really down the leg or radicular.  She has had spine injections for a different provider and we had a prior note with her evaluation management a few weeks ago.  Injection in the subacromial space on the left done at that time did give her good relief.  X-ray imaging showed mild degenerative change.  She does not get the groin pain all the time but it can be there with certain movements.  We will see how she does diagnostically and therapeutically with the injection.  ROS Otherwise per HPI.  Assessment & Plan: Visit Diagnoses:  1. Pain in right hip     Plan: Findings:  Patient was able to tell a difference during the anesthetic phase of the injection.    Meds & Orders: No orders of the defined types were placed in this encounter.   Orders Placed This Encounter  Procedures  . Large Joint Inj  . XR C-ARM NO REPORT    Follow-up: No follow-ups on file.   Procedures: Large Joint Inj: R hip joint on 01/02/2019 11:25 AM Indications: pain and diagnostic evaluation Details: 22 G needle, anterior approach  Arthrogram: Yes  Medications: 80 mg triamcinolone acetonide 40 MG/ML; 4 mL bupivacaine 0.25 % Outcome: tolerated well, no immediate complications  Arthrogram demonstrated excellent flow of contrast throughout the joint surface without extravasation or obvious defect.  The patient had  relief of symptoms during the anesthetic phase of the injection.  Procedure, treatment alternatives, risks and benefits explained, specific risks discussed. Consent was given by the patient. Immediately prior to procedure a time out was called to verify the correct patient, procedure, equipment, support staff and site/side marked as required. Patient was prepped and draped in the usual sterile fashion.      No notes on file   Clinical History: No specialty comments available.     Objective:  VS:  HT:    WT:   BMI:     BP:   HR: bpm  TEMP: ( )  RESP:  Physical Exam Musculoskeletal:     Comments: Patient ambulates without aid.  She does have painful hip rotation at the very end range of rotation but otherwise good hip movement.     Ortho Exam Imaging: Xr C-arm No Report  Result Date: 01/02/2019 Please see Notes tab for imaging impression.

## 2019-01-25 ENCOUNTER — Telehealth: Payer: Self-pay | Admitting: *Deleted

## 2019-01-25 NOTE — Telephone Encounter (Signed)
Pt is scheduled for 02/07/2019 for and ov.

## 2019-01-25 NOTE — Telephone Encounter (Signed)
Ok OV

## 2019-01-26 ENCOUNTER — Other Ambulatory Visit: Payer: Self-pay | Admitting: Obstetrics and Gynecology

## 2019-01-26 DIAGNOSIS — Z1231 Encounter for screening mammogram for malignant neoplasm of breast: Secondary | ICD-10-CM

## 2019-02-06 MED ORDER — BUPIVACAINE HCL 0.25 % IJ SOLN
4.0000 mL | INTRAMUSCULAR | Status: AC | PRN
  Administered 2018-12-21: 4 mL via INTRA_ARTICULAR

## 2019-02-06 MED ORDER — TRIAMCINOLONE ACETONIDE 40 MG/ML IJ SUSP
40.0000 mg | INTRAMUSCULAR | Status: AC | PRN
Start: 2018-12-21 — End: 2018-12-21
  Administered 2018-12-21: 40 mg via INTRA_ARTICULAR

## 2019-02-07 ENCOUNTER — Ambulatory Visit: Payer: Medicare Other | Admitting: Physical Medicine and Rehabilitation

## 2019-02-21 ENCOUNTER — Encounter: Payer: Self-pay | Admitting: Physical Medicine and Rehabilitation

## 2019-02-21 ENCOUNTER — Ambulatory Visit (INDEPENDENT_AMBULATORY_CARE_PROVIDER_SITE_OTHER): Payer: Medicare Other | Admitting: Physical Medicine and Rehabilitation

## 2019-02-21 VITALS — BP 120/59 | HR 59

## 2019-02-21 DIAGNOSIS — G8929 Other chronic pain: Secondary | ICD-10-CM

## 2019-02-21 DIAGNOSIS — M545 Low back pain, unspecified: Secondary | ICD-10-CM

## 2019-02-21 DIAGNOSIS — M7918 Myalgia, other site: Secondary | ICD-10-CM

## 2019-02-21 DIAGNOSIS — M47816 Spondylosis without myelopathy or radiculopathy, lumbar region: Secondary | ICD-10-CM

## 2019-02-21 DIAGNOSIS — M419 Scoliosis, unspecified: Secondary | ICD-10-CM

## 2019-02-21 DIAGNOSIS — G894 Chronic pain syndrome: Secondary | ICD-10-CM | POA: Diagnosis not present

## 2019-02-21 NOTE — Progress Notes (Signed)
  Numeric Pain Rating Scale and Functional Assessment Average Pain 3   In the last MONTH (on 0-10 scale) has pain interfered with the following?  1. General activity like being  able to carry out your everyday physical activities such as walking, climbing stairs, carrying groceries, or moving a chair?  Rating(5)

## 2019-03-03 ENCOUNTER — Ambulatory Visit
Admission: RE | Admit: 2019-03-03 | Discharge: 2019-03-03 | Disposition: A | Discharge status: Home / Self Care | Payer: Medicare Other | Source: Ambulatory Visit | Attending: Obstetrics and Gynecology | Admitting: Obstetrics and Gynecology

## 2019-03-03 DIAGNOSIS — Z1231 Encounter for screening mammogram for malignant neoplasm of breast: Secondary | ICD-10-CM

## 2019-04-12 ENCOUNTER — Encounter: Payer: Self-pay | Admitting: Physical Medicine and Rehabilitation

## 2019-04-12 DIAGNOSIS — G4733 Obstructive sleep apnea (adult) (pediatric): Secondary | ICD-10-CM | POA: Insufficient documentation

## 2019-04-12 DIAGNOSIS — M47816 Spondylosis without myelopathy or radiculopathy, lumbar region: Secondary | ICD-10-CM | POA: Insufficient documentation

## 2019-04-12 DIAGNOSIS — F341 Dysthymic disorder: Secondary | ICD-10-CM | POA: Insufficient documentation

## 2019-04-12 DIAGNOSIS — M8580 Other specified disorders of bone density and structure, unspecified site: Secondary | ICD-10-CM | POA: Insufficient documentation

## 2019-04-12 DIAGNOSIS — M7918 Myalgia, other site: Secondary | ICD-10-CM | POA: Insufficient documentation

## 2019-04-12 DIAGNOSIS — Z9989 Dependence on other enabling machines and devices: Secondary | ICD-10-CM | POA: Insufficient documentation

## 2019-04-12 NOTE — Progress Notes (Signed)
Whitney [name] - 78 y.o. female MRN [id number]  Date of birth: Nov 01, 1940  Office Visit Note: Visit Date: 02/21/2019 PCP: Dian Queen, MD Referred by: Dian Queen, MD  Subjective: Chief Complaint  Patient presents with   Lower Back - Pain   HPI: Whitney [name] is a 78 y.o. female who comes in today For reevaluation of chronic worsening low back pain.  She has had in our office now left subacromial injection of the shoulder that did well she had been getting those through [facility].  She also received hip injection that gave her quite a bit of relief of her hip pain.  Hip x-rays have been unrevealing from my standpoint of severe arthritis.  She has had spine injections by Dr. Nelva Bush.  We have reviewed his notes.  Does appear like mainly with epidural injections.  She has had MRI over the last couple of years showing mainly some scoliosis with some facet arthropathy without high-grade stenosis.  She has had no prior lumbar surgery.  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  She is also had care from an orthopedic standpoint at [facility].  Her primary care providers have been at [facility].  She is looking to try to change to a provider here in the [place] area.  She has multiple intolerances to medications particularly opioid type medications.  Case is further complicated from an injection standpoint with contrast dye allergy.  She reports significant pain relief since the hip injection in June but she still having more right-sided lower back pain but not really referring into the lateral thigh or anterior thigh.  Pain increases with bending over.  She rates her pain as a 3 out of 10.  The pain is somewhat worse going from bending to standing.  She has had no other red flag complaints.  No paresthesias no radicular pain.  Review of Systems  Constitutional: Negative for chills, fever, malaise/fatigue and weight loss.  HENT: Negative for hearing loss and  sinus pain.   Eyes: Negative for blurred vision, double vision and photophobia.  Respiratory: Negative for cough and shortness of breath.   Cardiovascular: Negative for chest pain, palpitations and leg swelling.  Gastrointestinal: Negative for abdominal pain, nausea and vomiting.  Genitourinary: Negative for flank pain.  Musculoskeletal: Positive for back pain. Negative for myalgias.  Skin: Negative for itching and rash.  Neurological: Negative for tremors, focal weakness and weakness.  Endo/Heme/Allergies: Negative.   Psychiatric/Behavioral: Negative for depression.  All other systems reviewed and are negative.  Otherwise per HPI.  Assessment & Plan: Visit Diagnoses:  1. Chronic right-sided low back pain without sciatica   2. Scoliosis of thoracolumbar spine, unspecified scoliosis type   3. Chronic pain syndrome   4. Myofascial pain syndrome   5. Spondylosis without myelopathy or radiculopathy, lumbar region     Plan: Findings:  Mechanical right-sided low back pain which is chronic.  Rates her pain as a 3 out of 10 otherwise doing fairly well.  Clearly had some issues with her right hip which is provided some relief with the anesthetic injection diagnostically and then with the cortisone.  Depending on changes with that we will have her see Dr. Jean Rosenthal in the office potentially.  We discussed at length activity modification and core strengthening and exercises.  With the level of pain that she is having I think she can manage this on her own right now without any sort of intervention.  Again depending on outcome or worsening would look at regrouping with physical therapist versus maybe updating MRI.  She has had physical therapy in the past and continues to do some exercises.  May benefit from some change in medication although hard with her with her liver disease as well as history of just intolerance with medications.  She does have some level of myofascial pain syndrome.     Meds & Orders: No orders of the defined types were placed in this encounter.  No orders of the defined types were placed in this encounter.   Follow-up: No follow-ups on file.   Procedures: No procedures performed  No notes on file   Clinical History: PROCEDURE:     MR  - MR LUMBAR SPINE WO CONTRAST  - Apr 14 2007  5:24PM   RESULT:     Multiplanar/multisequence imaging of the lumbar spine is  obtained without the administration of gadolinium.   The conus medullaris terminates at an L1 level. The cauda equina  demonstrate  no evidence of clumping or thickening.   At the T11-12, T12-L1 levels there is no evidence of thecal sac stenosis  or  exiting nerve root compression or compromise.  There is a questionable  area  of thecal sac stenosis at the T10 level. This may be volume averaging  though  if the patient has persistent complaints of pain and/or clinical concern  further evaluation with MRI of the thoracic spine is recommended.   At the L1-2 level a mild broad-based disc bulge is appreciated. There is  minimal effacement of the anterior CSF space without evidence of exiting  nerve root compression or compromise.   At the L2-L3, L3-4, L4-5, and L5-1 levels there is no evidence of thecal  sac  stenosis, neural foraminal narrowing or exiting nerve root compression or  compromise.   IMPRESSION:   1.Mild broad-based disc bulge at the L1-L2 level with resulting mild  thecal  sac narrowing. There does not appear to be evidence of exiting nerve root  compression or compromise.  2.Findings are incompletely evaluated at the T10 level which may simply  represent volume averaging though an area of thecal sac stenosis cannot be  excluded and if the patient has persistent pain or if there is clinical  concern, further evaluation with thoracic MRI is recommended.   She reports that she quit smoking about 43 years ago. Her smoking use included cigarettes. She has never  used smokeless tobacco. No results for input(s): HGBA1C, LABURIC in the last 8760 hours.  Objective:  VS:  HT:     WT:    BMI:      BP:(!) 120/59   HR:(!) 59bpm   TEMP: ( )   RESP:  Physical Exam Vitals signs and nursing note reviewed.  Constitutional:      General: She is not in acute distress.    Appearance: Normal appearance. She is well-developed. She is not ill-appearing.  HENT:     Head: Normocephalic and atraumatic.  Eyes:     Conjunctiva/sclera: Conjunctivae normal.     Pupils: Pupils are equal, round, and reactive to light.  Cardiovascular:     Rate and Rhythm: Normal rate.     Pulses: Normal pulses.  Pulmonary:     Effort: Pulmonary effort is normal.  Musculoskeletal:     Right lower leg: No edema.     Left lower leg: No edema.     Comments: Pain going from sit to full extension and  facet loading.  No pain over the greater trochanters although somewhat tender on the right greater trochanter but not exquisite.  No pain with hip rotation today although at end ranges it does seem to bother.  No pain over the left hip.  She has good distal strength without any clonus.  She has myofascial trigger points in the paraspinal musculature region of the lumbosacral junction.  Skin:    General: Skin is warm and dry.     Findings: No erythema or rash.  Neurological:     General: No focal deficit present.     Mental Status: She is alert and oriented to person, place, and time.     Sensory: No sensory deficit.     Motor: No abnormal muscle tone.     Coordination: Coordination normal.     Gait: Gait normal.  Psychiatric:        Mood and Affect: Mood normal.        Behavior: Behavior normal.     Ortho Exam Imaging: No results found.  Past Medical/Family/Surgical/Social History: Medications & Allergies reviewed per EMR, new medications updated. Patient Active Problem List   Diagnosis Date Noted   Dysthymia 04/12/2019   OSA on CPAP 04/12/2019   Osteopenia 04/12/2019    Spondylosis without myelopathy or radiculopathy, lumbar region 04/12/2019   Myofascial pain syndrome 04/12/2019   Chronic right-sided low back pain without sciatica 12/16/2018   Osteoarthritis of carpometacarpal [facility]) joint of thumb 07/25/2018   Liver cirrhosis secondary to NASH ([place]) 10/08/2015   Diabetes mellitus type 2, controlled, without complications ([place]) 123XX123   Vitamin D deficiency 03/29/2013   Hyperlipidemia, unspecified 05/06/2011   NAFLD (nonalcoholic fatty liver disease) 05/06/2011   Obesity (BMI 30-39.9) 05/06/2011   VULVAR VESTIBULITIS 07/30/2009   HTN (hypertension) 07/07/2007   H/O colon cancer, stage I 07/06/2005   Past Medical History:  Diagnosis Date   Allergy    Cancer ([place]) 2007   Colon Cancer stage 1  surgery only.  no chemo or xrt.   Diabetes mellitus without complication ([place])    Hypertension    Family History  Problem Relation Age of Onset   Breast cancer Neg Hx    Past Surgical History:  Procedure Laterality Date   BREAST EXCISIONAL BIOPSY Bilateral 60's, 70's, 80's   Benign   Social History   Occupational History   Not on file  Tobacco Use   Smoking status: Former Smoker    Types: Cigarettes    Quit date: 07/18/1975    Years since quitting: 43.7   Smokeless tobacco: Never Used  Substance and Sexual Activity   Alcohol use: Yes   Drug use: No   Sexual activity: Not on file

## 2019-05-22 ENCOUNTER — Telehealth: Payer: Self-pay | Admitting: Physical Medicine and Rehabilitation

## 2019-05-22 NOTE — Telephone Encounter (Signed)
Yes if she feels like that is the issue.

## 2019-05-22 NOTE — Telephone Encounter (Signed)
Scheduled for 12/10 for ov with hip injection.

## 2019-06-12 DIAGNOSIS — M25551 Pain in right hip: Secondary | ICD-10-CM | POA: Diagnosis not present

## 2019-06-15 ENCOUNTER — Ambulatory Visit: Payer: Self-pay

## 2019-06-15 ENCOUNTER — Ambulatory Visit (INDEPENDENT_AMBULATORY_CARE_PROVIDER_SITE_OTHER): Payer: Medicare Other | Admitting: Physical Medicine and Rehabilitation

## 2019-06-15 ENCOUNTER — Encounter: Payer: Self-pay | Admitting: Physical Medicine and Rehabilitation

## 2019-06-15 ENCOUNTER — Other Ambulatory Visit: Payer: Self-pay

## 2019-06-15 DIAGNOSIS — M25551 Pain in right hip: Secondary | ICD-10-CM

## 2019-06-15 NOTE — Progress Notes (Signed)
Numeric Pain Rating Scale and Functional Assessment Average Pain 3   In the last MONTH (on 0-10 scale) has pain interfered with the following?  1. General activity like being  able to carry out your everyday physical activities such as walking, climbing stairs, carrying groceries, or moving a chair?  occasionally Rating (5)   +Driver, -BT, -Dye Allergies.  Started Cefdinir 06/14/2019 for sinus infection--took 2 yesterday  Was asking if she can use volteran gell on Hip or back  Has pain with siting in chair, or laying in the bed, getting out of the chair.

## 2019-09-07 ENCOUNTER — Telehealth: Payer: Self-pay | Admitting: Physical Medicine and Rehabilitation

## 2019-09-07 NOTE — Telephone Encounter (Signed)
Last injections have been right hip intra-artic. If she feels this is same as prior then ok to re-peat if different and no trauma etc then ok L5-S1 interlam (?BT) with OV

## 2019-09-07 NOTE — Telephone Encounter (Signed)
Scheduled for 3/22 at 1000 with driver and no blood thinners.

## 2019-09-25 ENCOUNTER — Ambulatory Visit (INDEPENDENT_AMBULATORY_CARE_PROVIDER_SITE_OTHER): Payer: Medicare Other | Admitting: Physical Medicine and Rehabilitation

## 2019-09-25 ENCOUNTER — Other Ambulatory Visit: Payer: Self-pay

## 2019-09-25 ENCOUNTER — Encounter: Payer: Self-pay | Admitting: Physical Medicine and Rehabilitation

## 2019-09-25 ENCOUNTER — Ambulatory Visit: Payer: Self-pay

## 2019-09-25 VITALS — BP 138/72 | HR 66

## 2019-09-25 DIAGNOSIS — M5416 Radiculopathy, lumbar region: Secondary | ICD-10-CM

## 2019-09-25 MED ORDER — METHYLPREDNISOLONE ACETATE 80 MG/ML IJ SUSP
40.0000 mg | Freq: Once | INTRAMUSCULAR | Status: AC
  Administered 2019-09-25: 40 mg

## 2019-09-25 NOTE — Progress Notes (Signed)
 .  Numeric Pain Rating Scale and Functional Assessment Average Pain 7 Pain Right Now 0 My pain is constant and aching Pain is worse with: bending and some activites Pain improves with: heat/ice and medication   In the last MONTH (on 0-10 scale) has pain interfered with the following?  1. General activity like being  able to carry out your everyday physical activities such as walking, climbing stairs, carrying groceries, or moving a chair?  Rating(7)  2. Relation with others like being able to carry out your usual social activities and roles such as  activities at home, at work and in your community. Rating(7)  3. Enjoyment of life such that you have  been bothered by emotional problems such as feeling anxious, depressed or irritable?  Rating(4)   +Driver, -BT, -Dye Allergies.

## 2019-09-26 NOTE — Procedures (Signed)
Lumbar Epidural Steroid Injection - Interlaminar Approach with Fluoroscopic Guidance  Patient: Whitney [name]      Date of Birth: [birth year] MRN: [id number] PCP: Dian Queen, MD      Visit Date: 09/25/2019   Universal Protocol:     Consent Given By: the patient  Position: PRONE  Additional Comments: Vital signs were monitored before and after the procedure. Patient was prepped and draped in the usual sterile fashion. The correct patient, procedure, and site was verified.   Injection Procedure Details:  Procedure Site One Meds Administered:  Meds ordered this encounter  Medications  . methylPREDNISolone acetate (DEPO-MEDROL) injection 40 mg     Laterality: Left  Location/Site:  L5-S1  Needle size: 20 G  Needle type: Tuohy  Needle Placement: Paramedian epidural  Findings:   -Comments: Excellent flow of contrast into the epidural space.  Procedure Details: Using a paramedian approach from the side mentioned above, the region overlying the inferior lamina was localized under fluoroscopic visualization and the soft tissues overlying this structure were infiltrated with 4 ml. of 1% Lidocaine without Epinephrine. The Tuohy needle was inserted into the epidural space using a paramedian approach.   The epidural space was localized using loss of resistance along with lateral and bi-planar fluoroscopic views.  After negative aspirate for air, blood, and CSF, a 2 ml. volume of Isovue-250 was injected into the epidural space and the flow of contrast was observed. Radiographs were obtained for documentation purposes.    The injectate was administered into the level noted above.   Additional Comments:  The patient tolerated the procedure well Dressing: 2 x 2 sterile gauze and Band-Aid    Post-procedure details: Patient was observed during the procedure. Post-procedure instructions were reviewed.  Patient left the clinic in stable condition.

## 2019-09-26 NOTE — Progress Notes (Signed)
Whitney [name] - 79 y.o. female MRN [id number]  Date of birth: 1941-03-22  Office Visit Note: Visit Date: 09/25/2019 PCP: Dian Queen, MD Referred by: Dian Queen, MD  Subjective: Chief Complaint  Patient presents with  . Lower Back - Pain  . Left Leg - Pain   HPI:  Whitney [name] is a 79 y.o. female who comes in today For planned possible left L5-S1 interlaminar epidural steroid injection.  She reports worsening symptoms of left radicular leg pain into the foot.  She reports worsening over the last several months but in particular last few weeks with some heavy lifting of boxes.  Bending and lifting makes the pain worse.  She is use medication ice and heat without much relief.  She does use gabapentin.  She rates her pain as a 7 out of 10 fairly constant and aching pain.  No red flag complaints of weakness or bowel bladder symptoms etc.  Patient is moving to [place] very soon and we been seeing her off-and-on for different pain complaints.  She used to see Dr. Suella Broad.  We have completed trigger point injections of the upper back.  Today we will complete diagnostic and hopefully therapeutic left L5-S1 interlaminar injection.  She has had remote history of reaction to contrast medium but according to the notes has done well more recently.  She also asked today if we use any epinephrine and the injection which we do not.  I also gave her 2 names for physiatrist in the [place] and [place] that she can try to transfer care to.  They are with [facility] in that area.  If she does not get relief with the injection MRI of the lumbar spine would need to be completed.  Concern for lumbar stenosis.  ROS Otherwise per HPI.  Assessment & Plan: Visit Diagnoses:  1. Lumbar radiculopathy     Plan: No additional findings.   Meds & Orders:  Meds ordered this encounter  Medications  . methylPREDNISolone acetate (DEPO-MEDROL) injection 40 mg    Orders  Placed This Encounter  Procedures  . XR C-ARM NO REPORT  . Epidural Steroid injection    Follow-up: Return if symptoms worsen or fail to improve.   Procedures: No procedures performed  Lumbar Epidural Steroid Injection - Interlaminar Approach with Fluoroscopic Guidance  Patient: Whitney [name]      Date of Birth: Apr 27, 1941 MRN: [id number] PCP: Dian Queen, MD      Visit Date: 09/25/2019   Universal Protocol:     Consent Given By: the patient  Position: PRONE  Additional Comments: Vital signs were monitored before and after the procedure. Patient was prepped and draped in the usual sterile fashion. The correct patient, procedure, and site was verified.   Injection Procedure Details:  Procedure Site One Meds Administered:  Meds ordered this encounter  Medications  . methylPREDNISolone acetate (DEPO-MEDROL) injection 40 mg     Laterality: Left  Location/Site:  L5-S1  Needle size: 20 G  Needle type: Tuohy  Needle Placement: Paramedian epidural  Findings:   -Comments: Excellent flow of contrast into the epidural space.  Procedure Details: Using a paramedian approach from the side mentioned above, the region overlying the inferior lamina was localized under fluoroscopic visualization and the soft tissues overlying this structure were infiltrated with 4 ml. of 1% Lidocaine without Epinephrine. The Tuohy needle was inserted into the epidural space using a paramedian approach.   The epidural space was localized using  loss of resistance along with lateral and bi-planar fluoroscopic views.  After negative aspirate for air, blood, and CSF, a 2 ml. volume of Isovue-250 was injected into the epidural space and the flow of contrast was observed. Radiographs were obtained for documentation purposes.    The injectate was administered into the level noted above.   Additional Comments:  The patient tolerated the procedure well Dressing: 2 x 2 sterile gauze and  Band-Aid    Post-procedure details: Patient was observed during the procedure. Post-procedure instructions were reviewed.  Patient left the clinic in stable condition.    Clinical History: PROCEDURE:     MR  - MR LUMBAR SPINE WO CONTRAST  - Apr 14 2007  5:24PM   RESULT:     Multiplanar/multisequence imaging of the lumbar spine is  obtained without the administration of gadolinium.   The conus medullaris terminates at an L1 level. The cauda equina  demonstrate  no evidence of clumping or thickening.   At the T11-12, T12-L1 levels there is no evidence of thecal sac stenosis  or  exiting nerve root compression or compromise.  There is a questionable  area  of thecal sac stenosis at the T10 level. This may be volume averaging  though  if the patient has persistent complaints of pain and/or clinical concern  further evaluation with MRI of the thoracic spine is recommended.   At the L1-2 level a mild broad-based disc bulge is appreciated. There is  minimal effacement of the anterior CSF space without evidence of exiting  nerve root compression or compromise.   At the L2-L3, L3-4, L4-5, and L5-1 levels there is no evidence of thecal  sac  stenosis, neural foraminal narrowing or exiting nerve root compression or  compromise.   IMPRESSION:   1.Mild broad-based disc bulge at the L1-L2 level with resulting mild  thecal  sac narrowing. There does not appear to be evidence of exiting nerve root  compression or compromise.  2.Findings are incompletely evaluated at the T10 level which may simply  represent volume averaging though an area of thecal sac stenosis cannot be  excluded and if the patient has persistent pain or if there is clinical  concern, further evaluation with thoracic MRI is recommended.     Objective:  VS:  HT:    WT:   BMI:     BP:138/72  HR:66bpm  TEMP: ( )  RESP:  Physical Exam Constitutional:      General: She is not in acute distress.     Appearance: Normal appearance. She is not ill-appearing.  HENT:     Head: Normocephalic and atraumatic.     Right Ear: External ear normal.     Left Ear: External ear normal.  Eyes:     Extraocular Movements: Extraocular movements intact.  Cardiovascular:     Rate and Rhythm: Normal rate.     Pulses: Normal pulses.  Musculoskeletal:     Right lower leg: No edema.     Left lower leg: No edema.     Comments: Patient has good distal strength with no pain over the greater trochanters.  No clonus or focal weakness.  Skin:    Findings: No erythema, lesion or rash.  Neurological:     General: No focal deficit present.     Mental Status: She is alert and oriented to person, place, and time.     Sensory: No sensory deficit.     Motor: No weakness or abnormal muscle tone.  Coordination: Coordination normal.  Psychiatric:        Mood and Affect: Mood normal.        Behavior: Behavior normal.     Ortho Exam Imaging: XR C-ARM NO REPORT  Result Date: 09/25/2019 Please see Notes tab for imaging impression.

## 2019-11-28 ENCOUNTER — Telehealth: Payer: Self-pay | Admitting: Physical Medicine and Rehabilitation

## 2019-11-28 NOTE — Telephone Encounter (Signed)
Patient has moved to [facility] and is requesting that her records be faxed to Dr. Len Childs at [phone].

## 2019-11-28 NOTE — Telephone Encounter (Signed)
Spoke with patient earlier this morning, emailed release  and she will fax back.

## 2019-11-29 ENCOUNTER — Telehealth: Payer: Self-pay | Admitting: Physical Medicine and Rehabilitation

## 2019-11-29 NOTE — Telephone Encounter (Signed)
Received call from pt this am stating that [facility] in [place] did not receive the records I faxed yesterday. They gave her a different fax number, [phone], I re faxed records to this number per patients request.

## 2019-12-11 MED ORDER — TRIAMCINOLONE ACETONIDE 40 MG/ML IJ SUSP
60.0000 mg | INTRAMUSCULAR | Status: AC | PRN
  Administered 2019-06-12: 60 mg via INTRA_ARTICULAR

## 2019-12-11 MED ORDER — BUPIVACAINE HCL 0.25 % IJ SOLN
4.0000 mL | INTRAMUSCULAR | Status: AC | PRN
  Administered 2019-06-12: 4 mL via INTRA_ARTICULAR

## 2019-12-11 NOTE — Progress Notes (Signed)
Whitney [name] - 79 y.o. female MRN [id number]  Date of birth: [birth year]  Office Visit Note: Visit Date: 06/15/2019 PCP: Dian Queen, MD Referred by: Dian Queen, MD  Subjective: Chief Complaint  Patient presents with  . Right Hip - Pain   HPI: Whitney [name] is a 79 y.o. female who comes in today for planned Right anesthetic hip arthrogram with fluoroscopic guidance.  The patient has failed conservative care including home exercise, medications, time and activity modification.  This injection will be diagnostic and hopefully therapeutic.  Please see requesting physician notes for further details and justification.  Patient received more than 50% pain relief from prior injection.   She is going to be moving soon and we can facilitate trying to find her an orthopedist or physiatrist in the area that she is moving and I will be happy to help her with that.  She does report recent starting of antibiotic for sinus infection that is fine in terms of the injection.  She also has a history of contrast dye allergy or intolerance but she has done well with a small amount used in joint injections and back injections.  She also asked today about using Voltaren gel and we went over the use of that and the fact that it is over-the-counter now.  She can use that on her hip.  ROS Otherwise per HPI.  Assessment & Plan: Visit Diagnoses:  1. Pain in right hip     Plan: No additional findings.   Meds & Orders: No orders of the defined types were placed in this encounter.   Orders Placed This Encounter  Procedures  . Large Joint Inj  . C-ARM NO Order    Follow-up: Return if symptoms worsen or fail to improve.   Procedures: Large Joint Inj: R hip joint on 06/12/2019 3:39 PM Indications: diagnostic evaluation and pain Details: 22 G 3.5 in needle, fluoroscopy-guided anterior approach  Arthrogram: No  Medications: 4 mL bupivacaine 0.25 %; 60 mg triamcinolone acetonide 40  MG/ML Outcome: tolerated well, no immediate complications  There was excellent flow of contrast producing a partial arthrogram of the hip. The patient did have relief of symptoms during the anesthetic phase of the injection. Procedure, treatment alternatives, risks and benefits explained, specific risks discussed. Consent was given by the patient. Immediately prior to procedure a time out was called to verify the correct patient, procedure, equipment, support staff and site/side marked as required. Patient was prepped and draped in the usual sterile fashion.      No notes on file   Clinical History: PROCEDURE:     MR  - MR LUMBAR SPINE WO CONTRAST  - Apr 14 2007  5:24PM   RESULT:     Multiplanar/multisequence imaging of the lumbar spine is  obtained without the administration of gadolinium.   The conus medullaris terminates at an L1 level. The cauda equina  demonstrate  no evidence of clumping or thickening.   At the T11-12, T12-L1 levels there is no evidence of thecal sac stenosis  or  exiting nerve root compression or compromise.  There is a questionable  area  of thecal sac stenosis at the T10 level. This may be volume averaging  though  if the patient has persistent complaints of pain and/or clinical concern  further evaluation with MRI of the thoracic spine is recommended.   At the L1-2 level a mild broad-based disc bulge is appreciated. There is  minimal effacement of the anterior CSF  space without evidence of exiting  nerve root compression or compromise.   At the L2-L3, L3-4, L4-5, and L5-1 levels there is no evidence of thecal  sac  stenosis, neural foraminal narrowing or exiting nerve root compression or  compromise.   IMPRESSION:   1.Mild broad-based disc bulge at the L1-L2 level with resulting mild  thecal  sac narrowing. There does not appear to be evidence of exiting nerve root  compression or compromise.  2.Findings are incompletely evaluated at the  T10 level which may simply  represent volume averaging though an area of thecal sac stenosis cannot be  excluded and if the patient has persistent pain or if there is clinical  concern, further evaluation with thoracic MRI is recommended.   She reports that she quit smoking about 44 years ago. Her smoking use included cigarettes. She has never used smokeless tobacco. No results for input(s): HGBA1C, LABURIC in the last 8760 hours.  Objective:  VS:  HT:    WT:   BMI:     BP:   HR: bpm  TEMP: ( )  RESP:  Physical Exam Constitutional:      General: She is not in acute distress.    Appearance: Normal appearance. She is not ill-appearing.  HENT:     Head: Normocephalic and atraumatic.     Right Ear: External ear normal.     Left Ear: External ear normal.  Eyes:     Extraocular Movements: Extraocular movements intact.  Cardiovascular:     Rate and Rhythm: Normal rate.     Pulses: Normal pulses.  Musculoskeletal:     Right lower leg: No edema.     Left lower leg: No edema.     Comments: Patient has good distal strength with no pain over the greater trochanters.  No clonus or focal weakness.  She has concordant pain with internal rotation of the right hip.  Skin:    Findings: No erythema, lesion or rash.  Neurological:     General: No focal deficit present.     Mental Status: She is alert and oriented to person, place, and time.     Sensory: No sensory deficit.     Motor: No weakness or abnormal muscle tone.     Coordination: Coordination normal.  Psychiatric:        Mood and Affect: Mood normal.        Behavior: Behavior normal.     Ortho Exam  Imaging: No results found.  Past Medical/Family/Surgical/Social History: Medications & Allergies reviewed per EMR, new medications updated. Patient Active Problem List   Diagnosis Date Noted  . Dysthymia 04/12/2019  . OSA on CPAP 04/12/2019  . Osteopenia 04/12/2019  . Spondylosis without myelopathy or radiculopathy, lumbar  region 04/12/2019  . Myofascial pain syndrome 04/12/2019  . Chronic right-sided low back pain without sciatica 12/16/2018  . Osteoarthritis of carpometacarpal (CMC) joint of thumb 07/25/2018  . Liver cirrhosis secondary to NASH ([place]) 10/08/2015  . Diabetes mellitus type 2, controlled, without complications ([place]) 01/60/1093  . Vitamin D deficiency 03/29/2013  . Hyperlipidemia, unspecified 05/06/2011  . NAFLD (nonalcoholic fatty liver disease) 05/06/2011  . Obesity (BMI 30-39.9) 05/06/2011  . VULVAR VESTIBULITIS 07/30/2009  . HTN (hypertension) 07/07/2007  . H/O colon cancer, stage I 07/06/2005   Past Medical History:  Diagnosis Date  . Allergy   . Cancer [facility]) 2007   Colon Cancer stage 1  surgery only.  no chemo or xrt.  . Diabetes mellitus without complication ([place])   .  Hypertension    Family History  Problem Relation Age of Onset  . Breast cancer Neg Hx    Past Surgical History:  Procedure Laterality Date  . BREAST EXCISIONAL BIOPSY Bilateral 60's, 70's, 80's   Benign   Social History   Occupational History  . Not on file  Tobacco Use  . Smoking status: Former Smoker    Types: Cigarettes    Quit date: 07/18/1975    Years since quitting: 44.4  . Smokeless tobacco: Never Used  Substance and Sexual Activity  . Alcohol use: Yes  . Drug use: No  . Sexual activity: Not on file

## 2020-10-31 DIAGNOSIS — I8500 Esophageal varices without bleeding: Secondary | ICD-10-CM | POA: Insufficient documentation

## 2021-07-15 DIAGNOSIS — I25118 Atherosclerotic heart disease of native coronary artery with other forms of angina pectoris: Secondary | ICD-10-CM | POA: Insufficient documentation

## 2021-11-28 ENCOUNTER — Other Ambulatory Visit: Payer: Self-pay | Admitting: Pediatrics

## 2021-11-28 DIAGNOSIS — E041 Nontoxic single thyroid nodule: Secondary | ICD-10-CM

## 2021-11-28 DIAGNOSIS — Z1231 Encounter for screening mammogram for malignant neoplasm of breast: Secondary | ICD-10-CM

## 2021-12-08 ENCOUNTER — Ambulatory Visit
Admission: RE | Admit: 2021-12-08 | Discharge: 2021-12-08 | Disposition: A | Discharge status: Home / Self Care | Payer: Medicare Other | Source: Ambulatory Visit | Attending: Pediatrics | Admitting: Pediatrics

## 2021-12-08 DIAGNOSIS — E041 Nontoxic single thyroid nodule: Secondary | ICD-10-CM | POA: Insufficient documentation

## 2022-01-12 ENCOUNTER — Ambulatory Visit
Admission: EM | Admit: 2022-01-12 | Discharge: 2022-01-12 | Disposition: A | Discharge status: Home / Self Care | Payer: Medicare Other | Attending: Emergency Medicine | Admitting: Emergency Medicine

## 2022-01-12 DIAGNOSIS — N390 Urinary tract infection, site not specified: Secondary | ICD-10-CM | POA: Insufficient documentation

## 2022-01-12 LAB — POCT URINALYSIS DIP (MANUAL ENTRY)
Blood, UA: NEGATIVE
Glucose, UA: NEGATIVE mg/dL
Nitrite, UA: POSITIVE — AB
Protein Ur, POC: 30 mg/dL — AB
Spec Grav, UA: >=1.03 — AB (ref 1.010–1.025)
Urobilinogen, UA: 1 E.U./dL
pH, UA: 5 (ref 5.0–8.0)

## 2022-01-12 MED ORDER — CEPHALEXIN 500 MG PO CAPS: 500.0000 mg | ORAL_CAPSULE | Freq: Two times a day (BID) | ORAL | 0 refills | Status: AC

## 2022-01-12 NOTE — Discharge Instructions (Addendum)
Take the antibiotic as directed.  The urine culture is pending.  We will call you if it shows the need to change or discontinue your antibiotic.    Follow up with your primary care provider if your symptoms are not improving.    

## 2022-01-12 NOTE — ED Triage Notes (Signed)
Patient presents to Urgent Care with complaints of possible UTI. C/o of urinary urgency and dysuria since last night. Hx of UTIs. Not treating symptoms.

## 2022-01-12 NOTE — ED Provider Notes (Signed)
[facility]    CSN: [id number] Arrival date & time: 01/12/22  1122      History   Chief Complaint Chief Complaint  Patient presents with   Urinary Frequency    HPI Whitney [name] is a 80 y.o. female.  Patient presents with dysuria and urinary urgency since last night.  She denies fever, chills, abdominal pain, flank pain, hematuria, vaginal discharge, pelvic pain, or other symptoms.  No treatments at home.  Her medical history includes diabetes and hypertension.  The history is provided by the patient and medical records.    Past Medical History:  Diagnosis Date   Allergy    Cancer (HCC) 2007   Colon Cancer stage 1  surgery only.  no chemo or xrt.   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      Home Medications    Prior to Admission medications   Medication Sig Start Date End Date Taking? Authorizing Provider  cephALEXin (KEFLEX) 500 MG capsule Take 1 capsule (500 mg total) by mouth 2 (two) times daily  for 5 days. 01/12/22 01/17/22 Yes Tate, Kelly H, NP  Acetaminophen 500 MG coapsule Take by mouth.    [provider]  amLODipine (NORVASC) 5 MG tablet Take by mouth. Reported on 07/18/2015    [provider]  aspirin EC 81 MG tablet Take by mouth.    [provider]  Blood Glucose Monitoring Suppl (FIFTY50 GLUCOSE METER 2.0) w/Device KIT Frequency:PHARMDIR   Dosage:0.0     Instructions:  Note:microfill contour strips BID Dose: 1 09/21/12   [provider]  Calcium Citrate-Vitamin D (CITRACAL MAXIMUM) 315-250 MG-UNIT TABS Take by mouth. 10/15/10   [provider]  carvedilol (COREG) 12.5 MG tablet Take by mouth. 10/09/09   [provider]  Cholecalciferol (VITAMIN D3) 2000 UNITS capsule Take by mouth.    [provider]  clobetasol (TEMOVATE) 0.05 % external solution APPLY TO SCALP TWICE A DAY AS NEEDED FOR ITCHING 10/10/18   [provider]  diazepam (VALIUM) 5 MG tablet Take 1 by mouth 1 hour  pre-procedure with very light food. May bring 2nd tablet to appointment. 12/21/18   Newton, Frederic, MD  dicyclomine (BENTYL) 20 MG tablet Take 1 tablet (20 mg total) every 6 (six) hours as needed by mouth. 05/12/17   Sung, Jade J, MD  escitalopram (LEXAPRO) 10 MG tablet escitalopram 10 mg tablet 09/02/18   [provider]  ferrous sulfate 325 (65 FE) MG tablet Take by mouth.    [provider]  fexofenadine (  ALLEGRA) 30 MG tablet Take 30 mg by mouth 2 (two) times daily.    [provider]  fluticasone (FLONASE) 50 MCG/ACT nasal spray Place into the nose. 10/15/10   [provider]  gabapentin (NEURONTIN) 300 MG capsule Take 1 capsule (300 mg total) 3 (three) times daily as needed by mouth. 05/12/17   Sung, Jade J, MD  Glucose Blood (IGLUCOSE TEST STRIPS VI) Check blood sugar daily as directed. 08/15/18   [provider]  linagliptin (TRADJENTA) 5 MG TABS tablet Take by mouth.    [provider]   losartan (COZAAR) 50 MG tablet Take by mouth. 06/14/07   [provider]  metFORMIN (GLUCOPHAGE-XR) 500 MG 24 hr tablet metformin ER 500 mg tablet,extended release 24 hr 09/21/18   [provider]  methocarbamol (ROBAXIN) 500 MG tablet TAKE 1 TABLET BY MOUTH FOUR TIMES A DAY AS NEEDED 12/05/18   [provider]  ondansetron (ZOFRAN ODT) 4 MG disintegrating tablet Take 1 tablet (4 mg total) every 8 (eight) hours as needed by mouth for nausea or vomiting. 05/12/17   Sung, Jade J, MD  pantoprazole (PROTONIX) 20 MG tablet Take 20 mg by mouth daily.    [provider]  pravastatin (PRAVACHOL) 10 MG tablet pravastatin 10 mg tablet 12/05/15   [provider]  spironolactone (ALDACTONE) 25 MG tablet Take by mouth. Reported on 07/18/2015    [provider]  traZODone (DESYREL) 50 MG tablet  12/18/18   [provider]    Family History Family History  Problem Relation Age of Onset   Breast cancer Neg Hx     Social History Social History   Tobacco Use   Smoking status: Former    Types: Cigarettes    Quit date: 07/18/1975    Years since quitting: 46.5   Smokeless tobacco: Never  Substance Use Topics   Alcohol use: Yes   Drug use: No     Allergies   Gluten meal, Lactose, Oxycodone, Propoxyphene, Sulfa antibiotics, Chlorthalidone, Codeine, Epinephrine, Ibuprofen, Iohexol, Penicillins, Propoxyphene n-acetaminophen, Tramadol, Hydrocodone, and Nitrofurantoin   Review of Systems Review of Systems  Constitutional:  Negative for chills and fever.  Gastrointestinal:  Negative for abdominal pain, diarrhea, nausea and vomiting.  Genitourinary:  Positive for dysuria and urgency. Negative for flank pain, hematuria, pelvic pain and vaginal discharge.  Skin:  Negative for color change and rash.  All other systems reviewed and are negative.    Physical Exam Triage Vital Signs ED Triage Vitals  Enc Vitals Group     BP      Pulse      Resp       Temp      Temp src      SpO2      Weight      Height      Head Circumference      Peak Flow      Pain Score      Pain Loc      Pain Edu?      Excl. in [place]?    No data found.  Updated Vital Signs BP 137/69   Pulse 66   Temp 98.2 F (36.8 C)   Resp 18   SpO2 96%   Visual Acuity Right Eye Distance:   Left Eye Distance:   Bilateral Distance:    Right Eye Near:   Left Eye Near:    Bilateral Near:     Physical Exam Vitals and nursing note reviewed.    Constitutional:      General: She is not in acute distress.    Appearance: Normal appearance. She is well-developed. She is not ill-appearing.  HENT:     Mouth/Throat:     Mouth: Mucous membranes are moist.  Cardiovascular:     Rate and Rhythm: Normal rate and regular rhythm.     Heart sounds: Normal heart sounds.  Pulmonary:     Effort: Pulmonary effort is normal. No respiratory distress.     Breath sounds: Normal breath sounds.  Abdominal:     General: Bowel sounds are normal.     Palpations: Abdomen is soft.     Tenderness: There is no abdominal tenderness. There is no right CVA tenderness, left CVA tenderness, guarding or rebound.  Musculoskeletal:     Cervical back: Neck supple.  Skin:    General: Skin is warm and dry.  Neurological:     Mental Status: She is alert.  Psychiatric:        Mood and Affect: Mood normal.        Behavior: Behavior normal.      UC Treatments / Results  Labs (all labs ordered are listed, but only abnormal results are displayed) Labs Reviewed  POCT URINALYSIS DIP (MANUAL ENTRY) - Abnormal; Notable for the following components:      Result Value   Clarity, UA cloudy (*)    Bilirubin, UA small (*)    Ketones, POC UA trace (5) (*)    Spec Grav, UA >=1.030 (*)    Protein Ur, POC =30 (*)    Nitrite, UA Positive (*)    Leukocytes, UA Small (1+) (*)    All other components within normal limits  URINE CULTURE    EKG   Radiology No results  found.  Procedures Procedures (including critical care time)  Medications Ordered in UC Medications - No data to display  Initial Impression / Assessment and Plan / UC Course  I have reviewed the triage vital signs and the nursing notes.  Pertinent labs & imaging results that were available during my care of the patient were reviewed by me and considered in my medical decision making (see chart for details).   UTI.  Treating with Keflex. Urine culture pending. Discussed with patient that we will call her if the urine culture shows the need to change or discontinue the antibiotic. Instructed her to follow-up with her PCP if her symptoms are not improving.  Education provided on UTI.  Patient agrees to plan of care.      Final Clinical Impressions(s) / UC Diagnoses   Final diagnoses:  Urinary tract infection without hematuria, site unspecified     Discharge Instructions      Take the antibiotic as directed.  The urine culture is pending.  We will call you if it shows the need to change or discontinue your antibiotic.    Follow up with your primary care provider if your symptoms are not improving.         ED Prescriptions     Medication Sig Dispense Auth. Provider   cephALEXin (KEFLEX) 500 MG capsule Take 1 capsule (500 mg total) by mouth 2 (two) times daily for 5 days. 10 capsule Tate, Kelly H, NP      PDMP not reviewed this encounter.   Tate, Kelly H, NP 01/12/22 1217  

## 2022-01-14 LAB — URINE CULTURE: Culture: >=100000 — AB

## 2022-02-05 ENCOUNTER — Emergency Department
Admission: EM | Admit: 2022-02-05 | Discharge: 2022-02-05 | Disposition: A | Discharge status: Home / Self Care | Payer: Medicare Other | Attending: Emergency Medicine | Admitting: Emergency Medicine

## 2022-02-05 ENCOUNTER — Emergency Department: Payer: Medicare Other

## 2022-02-05 ENCOUNTER — Encounter: Payer: Self-pay | Admitting: Intensive Care

## 2022-02-05 ENCOUNTER — Other Ambulatory Visit: Payer: Self-pay

## 2022-02-05 DIAGNOSIS — H532 Diplopia: Secondary | ICD-10-CM | POA: Diagnosis not present

## 2022-02-05 DIAGNOSIS — Z79899 Other long term (current) drug therapy: Secondary | ICD-10-CM | POA: Diagnosis not present

## 2022-02-05 DIAGNOSIS — C719 Malignant neoplasm of brain, unspecified: Secondary | ICD-10-CM | POA: Diagnosis not present

## 2022-02-05 DIAGNOSIS — K7581 Nonalcoholic steatohepatitis (NASH): Secondary | ICD-10-CM | POA: Insufficient documentation

## 2022-02-05 DIAGNOSIS — R200 Anesthesia of skin: Secondary | ICD-10-CM | POA: Diagnosis present

## 2022-02-05 DIAGNOSIS — G9389 Other specified disorders of brain: Secondary | ICD-10-CM

## 2022-02-05 DIAGNOSIS — Z85038 Personal history of other malignant neoplasm of large intestine: Secondary | ICD-10-CM | POA: Insufficient documentation

## 2022-02-05 DIAGNOSIS — Z8505 Personal history of malignant neoplasm of liver: Secondary | ICD-10-CM | POA: Insufficient documentation

## 2022-02-05 HISTORY — DX: Arterial fibromuscular dysplasia: I77.3

## 2022-02-05 HISTORY — DX: Nonalcoholic steatohepatitis (NASH): K75.81

## 2022-02-05 LAB — DIFFERENTIAL
Abs Immature Granulocytes: 0.02 10*3/uL (ref 0.00–0.07)
Basophils Absolute: 0 10*3/uL (ref 0.0–0.1)
Basophils Relative: 0 %
Eosinophils Absolute: 0.2 10*3/uL (ref 0.0–0.5)
Eosinophils Relative: 4 %
Immature Granulocytes: 0 %
Lymphocytes Relative: 29 %
Lymphs Abs: 1.6 10*3/uL (ref 0.7–4.0)
Monocytes Absolute: 0.4 10*3/uL (ref 0.1–1.0)
Monocytes Relative: 8 %
Neutro Abs: 3.1 10*3/uL (ref 1.7–7.7)
Neutrophils Relative %: 59 %

## 2022-02-05 LAB — COMPREHENSIVE METABOLIC PANEL
ALT: 26 U/L (ref 0–44)
AST: 42 U/L — ABNORMAL HIGH (ref 15–41)
Albumin: 4.7 g/dL (ref 3.5–5.0)
Alkaline Phosphatase: 64 U/L (ref 38–126)
Anion gap: 9 (ref 5–15)
BUN: 11 mg/dL (ref 8–23)
CO2: 22 mmol/L (ref 22–32)
Calcium: 9.5 mg/dL (ref 8.9–10.3)
Chloride: 102 mmol/L (ref 98–111)
Creatinine, Ser: 0.6 mg/dL (ref 0.44–1.00)
GFR, Estimated: >60 mL/min (ref >60)
Glucose, Bld: 116 mg/dL — ABNORMAL HIGH (ref 70–99)
Potassium: 4.5 mmol/L (ref 3.5–5.1)
Sodium: 133 mmol/L — ABNORMAL LOW (ref 135–145)
Total Bilirubin: 0.9 mg/dL (ref 0.3–1.2)
Total Protein: 8.4 g/dL — ABNORMAL HIGH (ref 6.5–8.1)

## 2022-02-05 LAB — CBC
HCT: 36.8 % (ref 36.0–46.0)
Hemoglobin: 12.3 g/dL (ref 12.0–15.0)
MCH: 28.7 pg (ref 26.0–34.0)
MCHC: 33.4 g/dL (ref 30.0–36.0)
MCV: 85.8 fL (ref 80.0–100.0)
Platelets: 113 10*3/uL — ABNORMAL LOW (ref 150–400)
RBC: 4.29 MIL/uL (ref 3.87–5.11)
RDW: 13.4 % (ref 11.5–15.5)
WBC: 5.3 10*3/uL (ref 4.0–10.5)
nRBC: 0 % (ref 0.0–0.2)

## 2022-02-05 LAB — PROTIME-INR
INR: 1.1 (ref 0.8–1.2)
Prothrombin Time: 13.9 seconds (ref 11.4–15.2)

## 2022-02-05 LAB — ETHANOL: Alcohol, Ethyl (B): <10 mg/dL (ref <10)

## 2022-02-05 LAB — CBG MONITORING, ED: Glucose-Capillary: 107 mg/dL — ABNORMAL HIGH (ref 70–99)

## 2022-02-05 LAB — APTT: aPTT: 34 seconds (ref 24–36)

## 2022-02-05 MED ORDER — DEXAMETHASONE SODIUM PHOSPHATE 10 MG/ML IJ SOLN
10.0000 mg | Freq: Once | INTRAMUSCULAR | Status: AC
  Administered 2022-02-05: 10 mg via INTRAVENOUS
  Filled 2022-02-05: qty 1

## 2022-02-05 MED ORDER — DEXAMETHASONE 4 MG PO TABS: 4.0000 mg | ORAL_TABLET | Freq: Two times a day (BID) | ORAL | 0 refills | Status: AC

## 2022-02-05 NOTE — ED Triage Notes (Addendum)
Patient c/o intermittent blurred vision that started this AM around 10am and right leg numbness that started at 5:30pm today. Patient reports ongoing dizziness for months and has scheduled MRI for tomorrow by neurologist she sees.   Also c/o right eye pain. States she saw a eye doctor a few days ago that told her she is having ocular migraines

## 2022-02-05 NOTE — Consult Note (Signed)
Consulting Department: Emergency department  Primary Physician:  [place]  Chief Complaint: Multifocal brain masses  History of Present Illness: 02/05/2022 Whitney [name] is a 81 y.o. female who presents with the chief complaint of new multifocal brain masses.  She has had approximately 6 to 9 months of progressive vertigo.  She has had a work-up including evaluation with neurology who recommended intracranial imaging.  She gets all of her [facility] at [facility].  Has a history of colon cancer in remission, also has diagnosis of Karlene Lineman and had 4 lesions in her liver.  She feels that she is having development of the right frontal headache.  She has not had any seizures.  She does feel like her right leg is becoming more numb.  Review of Systems:  A 10 point review of systems is negative, except for the pertinent positives and negatives detailed in the HPI.  Past Medical History: Past Medical History:  Diagnosis Date   Allergy    Cancer ([place]) 2007   Colon Cancer stage 1  surgery only.  no chemo or xrt.   Diabetes mellitus without complication ([place])    Fibromuscular dysplasia (HCC)    Hypertension    NASH (nonalcoholic steatohepatitis)     Past Surgical History: Past Surgical History:  Procedure Laterality Date   BREAST EXCISIONAL BIOPSY Bilateral 60's, 70's, 80's   Benign    Allergies: Allergies as of 02/05/2022 - Review Complete 02/05/2022  Allergen Reaction Noted   Gluten meal Diarrhea and Other (See Comments) 04/27/2012   Lactose Other (See Comments) 02/15/2014   Oxycodone Itching 03/29/2013   Propoxyphene Other (See Comments) 07/30/2009   Sulfa antibiotics Rash 10/26/2012   Chlorthalidone Other (See Comments) 11/09/2013   Codeine Itching 07/13/2016   Epinephrine  07/30/2009   Ibuprofen  07/30/2009   Iohexol  02/24/2006   Penicillins  07/30/2009   Propoxyphene n-acetaminophen  07/30/2009   Tramadol  07/13/2016   Hydrocodone Rash 01/11/2014    Nitrofurantoin Nausea Only 11/19/2015    Medications: No current facility-administered medications for this encounter.  Current Outpatient Medications:    dexamethasone (DECADRON) 4 MG tablet, Take 1 tablet (4 mg total) by mouth 2 (two) times daily with a meal for 14 days., Disp: 28 tablet, Rfl: 0   amLODipine (NORVASC) 10 MG tablet, Take 10 mg by mouth daily., Disp: , Rfl:    aspirin EC 81 MG tablet, Take by mouth., Disp: , Rfl:    Blood Glucose Monitoring Suppl (FIFTY50 GLUCOSE METER 2.0) w/Device KIT, Frequency:PHARMDIR   Dosage:0.0     Instructions:  Note:microfill contour strips BID Dose: 1, Disp: , Rfl:    carvedilol (COREG) 6.25 MG tablet, Take 6.25 mg by mouth 2 (two) times daily., Disp: , Rfl:    Cholecalciferol (VITAMIN D3) 2000 UNITS capsule, Take by mouth., Disp: , Rfl:    escitalopram (LEXAPRO) 10 MG tablet, escitalopram 10 mg tablet, Disp: , Rfl:    ferrous sulfate 325 (65 FE) MG tablet, Take by mouth., Disp: , Rfl:    fluticasone (FLONASE) 50 MCG/ACT nasal spray, Place into the nose., Disp: , Rfl:    lisinopril (ZESTRIL) 40 MG tablet, Take 40 mg by mouth daily., Disp: , Rfl:    meclizine (ANTIVERT) 25 MG tablet, Take 25 mg by mouth every 8 (eight) hours as needed., Disp: , Rfl:    methocarbamol (ROBAXIN) 500 MG tablet, TAKE 1 TABLET BY MOUTH FOUR TIMES A DAY AS NEEDED, Disp: , Rfl:  omeprazole (PRILOSEC) 20 MG capsule, Take 20 mg by mouth daily., Disp: , Rfl:    pravastatin (PRAVACHOL) 10 MG tablet, pravastatin 10 mg tablet, Disp: , Rfl:    traZODone (DESYREL) 50 MG tablet, , Disp: , Rfl:    Social History: Social History   Tobacco Use   Smoking status: Former    Types: Cigarettes    Quit date: 07/18/1975    Years since quitting: 46.5   Smokeless tobacco: Never  Vaping Use   Vaping Use: Never used  Substance Use Topics   Alcohol use: Yes   Drug use: No    Family Medical History: Family History  Problem Relation Age of Onset   Breast cancer Neg Hx      Physical Examination: Vitals:   02/05/22 2017 02/05/22 2200  BP: (!) 176/69 (!) 125/106  Pulse: 90 85  Resp: 17 18  Temp:  98.7 F (37.1 C)  SpO2: 98% 96%     General: Patient is well developed, well nourished, calm, collected, and in no apparent distress.  NEUROLOGICAL:  General: In no acute distress.   Awake, alert, oriented to person, place, and time.  Pupils equal round and reactive to light.  Full Facial tone is symmetric.  Tongue protrusion is midline.  Bilateral upper extremities are full strength proximally and distally.  There is no pronator drift.  Language is conversant.  Imaging: Narrative & Impression  CLINICAL DATA:  Diplopia history of liver cancer   EXAM: CT HEAD WITHOUT CONTRAST   TECHNIQUE: Contiguous axial images were obtained from the base of the skull through the vertex without intravenous contrast.   RADIATION DOSE REDUCTION: This exam was performed according to the departmental dose-optimization program which includes automated exposure control, adjustment of the mA and/or kV according to patient size and/or use of iterative reconstruction technique.   COMPARISON:  None Available.   FINDINGS: Brain: Numerous round hyperdense lesions in the bilateral cerebral hemispheres and cerebellum, the largest of which include a right occipital lesion that measures up to 23 mm (series 2, image 17) and a midline cerebellar lesion that measures up to 26 mm (series 2, image 11). Additional smaller lesions are noted on images 17, 19, 20, 22, 23, 24 and 26. Edema is noted surrounding the larger lesions. No significant mass effect or midline shift. No hydrocephalus or extra-axial collection.   Vascular: No hyperdense vessel. Atherosclerotic calcifications in the intracranial carotid and vertebral arteries.   Skull: Negative for fracture or focal lesion.   Sinuses/Orbits: No acute finding.   Other: The mastoids are well aerated.   IMPRESSION: Numerous  hyperdense lesions in the bilateral cerebral hemispheres and in the cerebellum, the largest of which have mild surrounding edema, most concerning for metastatic disease. An MRI with and without contrast is recommended for further evaluation.   These results were called by telephone at the time of interpretation on 02/05/2022 at 7:13 pm to provider [facility] , who verbally acknowledged these results.     Electronically Signed   By: Merilyn Baba M.D.   On: 02/05/2022 19:14      I have personally reviewed the images and agree with the above interpretation.  Labs:    Latest Ref Rng & Units 02/05/2022    7:06 PM 05/11/2017    2:47 PM  CBC  WBC 4.0 - 10.5 K/uL 5.3  7.2   Hemoglobin 12.0 - 15.0 g/dL 12.3  10.8   Hematocrit 36.0 - 46.0 % 36.8  32.5   Platelets 150 -  400 K/uL 113  179        Assessment and Plan: Ms. Barbone is a pleasant 81 y.o. female with history of colon cancer as well as NASH with 4 liver lesions.  She has had a approximately 67-monthhistory of progressive vertigo and imbalance.  She has also developed a headache some double vision.  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  1 is well in her posterior fossa without obstruction of the fourth ventricle or any signs of hydrocephalus.  On exam she does have some cranial nerve findings but from an overall alertness standpoint is easily conversant and has no major gross deficits on extremity movements.  She does have an MRI with and without contrast scheduled for tomorrow in the [facility] system.  We will plan to reach out to her primary oncologist as well [facility].  Patient and partner agreed to this plan and would like to discharge home until their appointment tomorrow.  We will plan on starting a IV dose of Decadron tonight, and a prescription for 4 mg twice daily of steroid,  she did develop some hypertension with her steroid administration.   BStevan Born MD/MSCR Dept. of Neurosurgery

## 2022-02-05 NOTE — Discharge Instructions (Signed)
Please be sure to obtain your MRI tomorrow and follow up with [facility]. Please seek medical attention for any high fevers, chest pain, shortness of breath, change in behavior, persistent vomiting, bloody stool or any other new or concerning symptoms.

## 2022-02-05 NOTE — ED Provider Notes (Signed)
[facility] Provider Note    Event Date/Time   First MD Initiated Contact with Patient 02/05/22 1935     (approximate)   History   Blurred Vision and Numbness   HPI  Whitney [name] is a 81 y.o. female  who presents to the emergency department today because of concern for right lower leg numbness. Numbness started today. Over the past week patient has had intermittent episodes of double vision.  Additionally the patient states that for the past 9 months she has been having issues with vertigo.  She has seen her doctor as well as ENT and recently neurology who is scheduled her for an MRI tomorrow.      Physical Exam   Triage Vital Signs: ED Triage Vitals  Enc Vitals Group     BP 02/05/22 1833 (!) 176/79     Pulse Rate 02/05/22 1833 81     Resp 02/05/22 1833 16     Temp 02/05/22 1833 98.4 F (36.9 C)     Temp Source 02/05/22 1833 Oral     SpO2 02/05/22 1833 98 %     Weight 02/05/22 1834 150 lb (68 kg)     Height 02/05/22 1834 '5\' 5"'$  (1.651 m)     Head Circumference --      Peak Flow --      Pain Score 02/05/22 1834 1     Pain Loc --      Pain Edu? --      Excl. in [place]? --     Most recent vital signs: Vitals:   02/05/22 1833 02/05/22 1919  BP: (!) 176/79 (!) 156/57  Pulse: 81 75  Resp: 16 17  Temp: 98.4 F (36.9 C)   SpO2: 98% 98%    General: Awake, alert, oriented. CV:  Good peripheral perfusion.  Resp:  Normal effort.   ED Results / Procedures / Treatments   Labs (all labs ordered are listed, but only abnormal results are displayed) Labs Reviewed  CBC - Abnormal; Notable for the following components:      Result Value   Platelets 113 (*)    All other components within normal limits  COMPREHENSIVE METABOLIC PANEL - Abnormal; Notable for the following components:   Sodium 133 (*)    Glucose, Bld 116 (*)    Total Protein 8.4 (*)    AST 42 (*)    All other components within normal limits  CBG MONITORING, ED - Abnormal; Notable  for the following components:   Glucose-Capillary 107 (*)    All other components within normal limits  PROTIME-INR  APTT  DIFFERENTIAL  ETHANOL  CBG MONITORING, ED     EKG  I, Nance Pear, attending physician, personally viewed and interpreted this EKG  EKG Time: 1903 Rate: 91 Rhythm: sinus rhythm Axis: normal Intervals: qtc 450 QRS: narrow ST changes: no st elevation Impression: normal ekg   RADIOLOGY I independently interpreted and visualized the CT head. My interpretation: multiple brain lesions Radiology interpretation:  IMPRESSION:  Numerous hyperdense lesions in the bilateral cerebral hemispheres  and in the cerebellum, the largest of which have mild surrounding  edema, most concerning for metastatic disease. An MRI with and  without contrast is recommended for further evaluation.   I, Nance Pear, personally discussed these images and results by phone with the on-call radiologist and used this discussion as part of my medical decision making.     PROCEDURES:  Critical Care performed: No  Procedures  MEDICATIONS ORDERED IN ED: Medications - No data to display   IMPRESSION / MDM / [place] / ED COURSE  I reviewed the triage vital signs and the nursing notes.                              Differential diagnosis includes, but is not limited to, CVA, intracranial bleed/mass, anemia, infection  Patient's presentation is most consistent with acute presentation with potential threat to life or bodily function.  Patient presented to the emergency department today because of concerns for right leg numbness.  CT scan today showed multiple lesions concerning for metastatic cancer.  1 lesion did have concerns for vasogenic edema.  Patient was written for a dose of IV steroids here.  Discussed this with the patient.  Additionally discussed with Dr. Tamala Julian with neurosurgery.  He did evaluate the patient.  At this time he felt comfortable with the  patient being discharged.  She already has MRI scheduled for tomorrow.  He will help arrange follow-up at [facility] for the patient.  Will give patient prescription for further steroids.   FINAL CLINICAL IMPRESSION(S) / ED DIAGNOSES   Final diagnoses:  Mass, brain    Note:  This document was prepared using Dragon voice recognition software and may include unintentional dictation errors.    Nance Pear, MD 02/05/22 2318

## 2022-02-07 ENCOUNTER — Encounter: Payer: Self-pay | Admitting: Emergency Medicine

## 2022-02-07 ENCOUNTER — Other Ambulatory Visit: Payer: Self-pay

## 2022-02-07 ENCOUNTER — Emergency Department
Admission: EM | Admit: 2022-02-07 | Discharge: 2022-02-07 | Disposition: A | Discharge status: Home / Self Care | Payer: Medicare Other | Attending: Emergency Medicine | Admitting: Emergency Medicine

## 2022-02-07 DIAGNOSIS — T7840XA Allergy, unspecified, initial encounter: Secondary | ICD-10-CM | POA: Insufficient documentation

## 2022-02-07 DIAGNOSIS — I119 Hypertensive heart disease without heart failure: Secondary | ICD-10-CM | POA: Insufficient documentation

## 2022-02-07 DIAGNOSIS — R21 Rash and other nonspecific skin eruption: Secondary | ICD-10-CM | POA: Diagnosis present

## 2022-02-07 DIAGNOSIS — R739 Hyperglycemia, unspecified: Secondary | ICD-10-CM

## 2022-02-07 DIAGNOSIS — E1165 Type 2 diabetes mellitus with hyperglycemia: Secondary | ICD-10-CM | POA: Insufficient documentation

## 2022-02-07 DIAGNOSIS — R9431 Abnormal electrocardiogram [ECG] [EKG]: Secondary | ICD-10-CM | POA: Insufficient documentation

## 2022-02-07 DIAGNOSIS — Z8673 Personal history of transient ischemic attack (TIA), and cerebral infarction without residual deficits: Secondary | ICD-10-CM | POA: Insufficient documentation

## 2022-02-07 DIAGNOSIS — I10 Essential (primary) hypertension: Secondary | ICD-10-CM | POA: Insufficient documentation

## 2022-02-07 DIAGNOSIS — C7931 Secondary malignant neoplasm of brain: Secondary | ICD-10-CM | POA: Diagnosis not present

## 2022-02-07 DIAGNOSIS — Z85038 Personal history of other malignant neoplasm of large intestine: Secondary | ICD-10-CM | POA: Diagnosis not present

## 2022-02-07 HISTORY — DX: Cerebral infarction, unspecified: I63.9

## 2022-02-07 LAB — CBC WITH DIFFERENTIAL/PLATELET
Abs Immature Granulocytes: 0.07 10*3/uL (ref 0.00–0.07)
Basophils Absolute: 0 10*3/uL (ref 0.0–0.1)
Basophils Relative: 0 %
Eosinophils Absolute: 0 10*3/uL (ref 0.0–0.5)
Eosinophils Relative: 0 %
HCT: 36.6 % (ref 36.0–46.0)
Hemoglobin: 12.2 g/dL (ref 12.0–15.0)
Immature Granulocytes: 1 %
Lymphocytes Relative: 10 %
Lymphs Abs: 1.1 10*3/uL (ref 0.7–4.0)
MCH: 28.6 pg (ref 26.0–34.0)
MCHC: 33.3 g/dL (ref 30.0–36.0)
MCV: 85.7 fL (ref 80.0–100.0)
Monocytes Absolute: 0.3 10*3/uL (ref 0.1–1.0)
Monocytes Relative: 3 %
Neutro Abs: 9.1 10*3/uL — ABNORMAL HIGH (ref 1.7–7.7)
Neutrophils Relative %: 86 %
Platelets: 164 10*3/uL (ref 150–400)
RBC: 4.27 MIL/uL (ref 3.87–5.11)
RDW: 13.7 % (ref 11.5–15.5)
WBC: 10.5 10*3/uL (ref 4.0–10.5)
nRBC: 0 % (ref 0.0–0.2)

## 2022-02-07 LAB — TROPONIN I (HIGH SENSITIVITY)
Troponin I (High Sensitivity): 8 ng/L (ref <18)
Troponin I (High Sensitivity): 10 ng/L (ref <18)

## 2022-02-07 LAB — BASIC METABOLIC PANEL
Anion gap: 16 — ABNORMAL HIGH (ref 5–15)
Anion gap: 12 (ref 5–15)
BUN: 18 mg/dL (ref 8–23)
BUN: 16 mg/dL (ref 8–23)
CO2: 17 mmol/L — ABNORMAL LOW (ref 22–32)
CO2: 21 mmol/L — ABNORMAL LOW (ref 22–32)
Calcium: 9.6 mg/dL (ref 8.9–10.3)
Calcium: 9.2 mg/dL (ref 8.9–10.3)
Chloride: 99 mmol/L (ref 98–111)
Chloride: 101 mmol/L (ref 98–111)
Creatinine, Ser: 0.65 mg/dL (ref 0.44–1.00)
Creatinine, Ser: 0.5 mg/dL (ref 0.44–1.00)
GFR, Estimated: >60 mL/min (ref >60)
GFR, Estimated: >60 mL/min (ref >60)
Glucose, Bld: 281 mg/dL — ABNORMAL HIGH (ref 70–99)
Glucose, Bld: 121 mg/dL — ABNORMAL HIGH (ref 70–99)
Potassium: 4.1 mmol/L (ref 3.5–5.1)
Potassium: 4 mmol/L (ref 3.5–5.1)
Sodium: 132 mmol/L — ABNORMAL LOW (ref 135–145)
Sodium: 134 mmol/L — ABNORMAL LOW (ref 135–145)

## 2022-02-07 LAB — BLOOD GAS, VENOUS
Acid-base deficit: 2.6 mmol/L — ABNORMAL HIGH (ref 0.0–2.0)
Bicarbonate: 19.7 mmol/L — ABNORMAL LOW (ref 20.0–28.0)
O2 Saturation: 98.8 %
Patient temperature: 37
pCO2, Ven: 27 mmHg — ABNORMAL LOW (ref 44–60)
pH, Ven: 7.47 — ABNORMAL HIGH (ref 7.25–7.43)
pO2, Ven: 89 mmHg — ABNORMAL HIGH (ref 32–45)

## 2022-02-07 LAB — FIBRINOGEN: Fibrinogen: 302 mg/dL (ref 210–475)

## 2022-02-07 LAB — BETA-HYDROXYBUTYRIC ACID: Beta-Hydroxybutyric Acid: 0.24 mmol/L (ref 0.05–0.27)

## 2022-02-07 LAB — PROTIME-INR
INR: 1.2 (ref 0.8–1.2)
Prothrombin Time: 14.8 seconds (ref 11.4–15.2)

## 2022-02-07 LAB — APTT: aPTT: 35 seconds (ref 24–36)

## 2022-02-07 MED ORDER — CARVEDILOL 6.25 MG PO TABS
6.2500 mg | ORAL_TABLET | Freq: Two times a day (BID) | ORAL | Status: DC
  Administered 2022-02-07: 6.25 mg via ORAL
  Filled 2022-02-07: qty 1

## 2022-02-07 MED ORDER — LACTATED RINGERS IV BOLUS
1000.0000 mL | Freq: Once | INTRAVENOUS | Status: AC
  Administered 2022-02-07: 1000 mL via INTRAVENOUS

## 2022-02-07 NOTE — ED Provider Triage Note (Signed)
Emergency Medicine Provider Triage Evaluation Note  Whitney [name] , a 81 y.o. female  was evaluated in triage.  Pt complains of possible allergic reaction that began today. She was seen on 02/05/22 and diagnosed with multiple brain masses. She was started on dexamethasone and has developed a rash to her arms to her face, arms, and upper chest. Denies itchiness. Reports that she has been having palpitations which she attributes to being nervous. Followed by [facility] neurology. Reports that she had an MRI with and without contrast yesterday. Reports that she has had contrast before.  Review of Systems  Positive: Rash, palpitations Negative: Tongue/lip swelling  Physical Exam  There were no vitals taken for this visit. Gen:   Awake, no distress   Resp:  Normal effort  MSK:   Moves extremities without difficulty  Other:  No obvious urticaria  Medical Decision Making  Medically screening exam initiated at 4:48 PM.  Appropriate orders placed.  Rockne Coons was informed that the remainder of the evaluation will be completed by another provider, this initial triage assessment does not replace that evaluation, and the importance of remaining in the ED until their evaluation is complete.   Marquette Old, PA-C 02/07/22 1655

## 2022-02-07 NOTE — ED Notes (Signed)
Urine sample sent to lab at this time ?

## 2022-02-07 NOTE — ED Triage Notes (Addendum)
Patient to ED via POV for allergic reaction. Patient is currently taking dexamethasone and now has rash on face, chest, and hands. Denies itching. Patient also states a small palpation- but denies CP.

## 2022-02-07 NOTE — ED Provider Notes (Signed)
[facility] Provider Note    Event Date/Time   First MD Initiated Contact with Patient 02/07/22 1825     (approximate)   History   Allergic Reaction   HPI  Whitney [name] is a 81 y.o. female with past medical history of Karlene Lineman, fibromuscular dysplasia, DM, HTN, CVA and previous colon cancer with recent diagnosis of metastatic brain lesions thought to possibly related to a recurrence of her colon cancer who presents for evaluation of a rash on her face chest and arms she noticed after initiation of Decadron following ED visit on 8/3.  She states she has had balance issues for months as well as some several weeks of some double vision and has had months of decree sensation in her right leg.  I reviewed the note from 8/3 during ED visit where she was seen by neurosurgery following a head CT that showed metastatic appearing brain lesions.  She was started on outpatient dexamethasone and had MRI ordered by [facility] neurology yesterday.   Patient results are copied below: "1.  Multifocal supratentorial and infratentorial hemorrhagic lesions with  possible minimal associated enhancement. Findings could represent  hemorrhagic metastases or other cause of multifocal hemorrhage such as  cerebral amyloid angiopathy or numerous cavernomas.  2.  No large vessel occlusion. No hemodynamically significant stenosis in  the neck.  3.  Right thyroid nodule measuring up to 2.5 cm. Consider further  evaluation with dedicated thyroid ultrasound. "    Past Medical History:  Diagnosis Date   Allergy    Cancer ([place]) 2007   Colon Cancer stage 1  surgery only.  no chemo or xrt.   Diabetes mellitus without complication ([place])    Fibromuscular dysplasia (HCC)    Hypertension    NASH (nonalcoholic steatohepatitis)    Stroke [facility])      Physical Exam  Triage Vital Signs: ED Triage Vitals [02/07/22 1651]  Enc Vitals Group     BP (!) 167/78     Pulse Rate (!) 103     Resp 18      Temp 98.3 F (36.8 C)     Temp Source Oral     SpO2 97 %     Weight 150 lb (68 kg)     Height '5\' 5"'$  (1.651 m)     Head Circumference      Peak Flow      Pain Score 0     Pain Loc      Pain Edu?      Excl. in [place]?     Most recent vital signs: Vitals:   02/07/22 2130 02/07/22 2140  BP: (!) 167/71   Pulse: 69 65  Resp: 10 14  Temp:    SpO2: 99% 97%    General: Awake, no distress.  CV:  Good peripheral perfusion.  2+ radial pulse Resp:  Normal effort.  Clear bilaterally Abd:  No distention.  Soft throughout. Other:  There is a fine minimally blanchable most petechial rash over the chest face and arms.  There is no blistering, induration, fluctuance or edema or tenderness   ED Results / Procedures / Treatments  Labs (all labs ordered are listed, but only abnormal results are displayed) Labs Reviewed  BASIC METABOLIC PANEL - Abnormal; Notable for the following components:      Result Value   Sodium 132 (*)    CO2 17 (*)    Glucose, Bld 281 (*)    Anion gap 16 (*)  All other components within normal limits  CBC WITH DIFFERENTIAL/PLATELET - Abnormal; Notable for the following components:   Neutro Abs 9.1 (*)    All other components within normal limits  BASIC METABOLIC PANEL - Abnormal; Notable for the following components:   Sodium 134 (*)    CO2 21 (*)    Glucose, Bld 121 (*)    All other components within normal limits  BLOOD GAS, VENOUS - Abnormal; Notable for the following components:   pH, Ven 7.47 (*)    pCO2, Ven 27 (*)    pO2, Ven 89 (*)    Bicarbonate 19.7 (*)    Acid-base deficit 2.6 (*)    All other components within normal limits  APTT  PROTIME-INR  FIBRINOGEN  BETA-HYDROXYBUTYRIC ACID  TROPONIN I (HIGH SENSITIVITY)  TROPONIN I (HIGH SENSITIVITY)     EKG  EKG is remarkable for sinus rhythm with a ventricular rate of 96, normal axis with nonspecific change versus artifact in lead II and lead III without other clear evidence of acute ischemia or  significant arrhythmia.    RADIOLOGY    PROCEDURES:  Critical Care performed: No  Procedures    MEDICATIONS ORDERED IN ED: Medications  carvedilol (COREG) tablet 6.25 mg (6.25 mg Oral Given 02/07/22 1933)  lactated ringers bolus 1,000 mL (0 mLs Intravenous Stopped 02/07/22 2115)     IMPRESSION / MDM / ASSESSMENT AND PLAN / ED COURSE  I reviewed the triage vital signs and the nursing notes. Patient's presentation is most consistent with acute presentation with potential threat to life or bodily function.                               Differential diagnosis includes, but is not limited to anaphylaxis, vasculitis, thrombocytopenia or DIC.  Presentation at this time is not suggestive of cellulitis or desquamating rash such as of acute drug reaction.  Patient denies any other acute symptoms or actual pain or itching from the rash and overall of her low suspicion for anaphylaxis at this time.  In addition this does not seem to be a classic allergic reaction which be very unusual for him for steroids.  It is possible this is delayed reaction from contrast or as he received yesterday for MRIs  EKG is remarkable for sinus rhythm with a ventricular rate of 96, normal axis with nonspecific change versus artifact in lead II and lead III without other clear evidence of acute ischemia or significant arrhythmia.  Nonelevated troponin x2 ordered in triage is not suggestive of ACS  BMP on arrival is remarkable for mild metabolic acidosis with a bicarb 17 anion gap of 16 and glucose of 281.  I suspect this is reactive in the setting of prior diabetes and initiation of steroids.  CBC without leukocytosis or acute anemia.  PTT, INR and fibrinogen are all unremarkable and not suggestive of DIC.  Patient given dose of her evening blood pressure medicines as well as IV fluids.  On recheck her labs there is no evidence of DKA or acidosis.  Her pH is 7.47 with PCO2 of 27 and bicarb of 19.7.  BMP with a sodium  of 134, glucose of 121 and bicarb 21 with anion gap of 12.  Beta-hydroxybutyrate 0.24  Discussed with patient at length that she should follow-up with her PCP to have her blood sugar rechecked and possibly her diabetes medicines adjusted on Monday or Tuesday.  I discussed patient  steroids with on-call neurosurgeon Dr. Tyler Pita who advised that he also felt it was unlikely to be related to her steroids and at this point the benefits outweigh potential risks and he recommended continuing these.  I discussed this with patient.  She will follow-up with her PCP and neurologist at [facility].  Discharged in stable condition.  Strict return precautions advised and discussed      FINAL CLINICAL IMPRESSION(S) / ED DIAGNOSES   Final diagnoses:  Rash  Hypertension, unspecified type  Hyperglycemia     Rx / DC Orders   ED Discharge Orders     None        Note:  This document was prepared using Dragon voice recognition software and may include unintentional dictation errors.   Lucrezia Starch, MD 02/07/22 2249

## 2022-02-07 NOTE — ED Notes (Signed)
IVF placed on pump

## 2022-02-17 DIAGNOSIS — Z8673 Personal history of transient ischemic attack (TIA), and cerebral infarction without residual deficits: Secondary | ICD-10-CM | POA: Insufficient documentation

## 2022-02-18 ENCOUNTER — Telehealth: Payer: Self-pay | Admitting: *Deleted

## 2022-02-18 NOTE — Telephone Encounter (Signed)
Patient is calling to ask for advise on an ingrown toenail and some medicines that she may apply or use before an upcoming surgery. Explained to patient that she can reach out to her pcp or surgeon ,has not been seen in 4 years ,will have to schedule appointment before any recommendations can be given, verbalized understanding.

## 2022-02-23 ENCOUNTER — Ambulatory Visit
Admission: EM | Admit: 2022-02-23 | Discharge: 2022-02-23 | Disposition: A | Discharge status: Home / Self Care | Payer: Medicare Other | Attending: Emergency Medicine | Admitting: Emergency Medicine

## 2022-02-23 DIAGNOSIS — R300 Dysuria: Secondary | ICD-10-CM

## 2022-02-23 LAB — POCT URINALYSIS DIP (MANUAL ENTRY)
Bilirubin, UA: NEGATIVE
Blood, UA: NEGATIVE
Glucose, UA: NEGATIVE mg/dL
Ketones, POC UA: NEGATIVE mg/dL
Leukocytes, UA: NEGATIVE
Nitrite, UA: NEGATIVE
Spec Grav, UA: 1.015 (ref 1.010–1.025)
Urobilinogen, UA: 1 E.U./dL
pH, UA: 8.5 — AB (ref 5.0–8.0)

## 2022-02-23 NOTE — Discharge Instructions (Addendum)
The urine culture is pending.  We will call you if it shows the need to for treatment.  Follow up with your primary care provider if your symptoms are not improving.

## 2022-02-23 NOTE — ED Provider Notes (Signed)
[facility]    CSN: [id number] Arrival date & time: 02/23/22  1255      History   Chief Complaint Chief Complaint  Patient presents with   Dysuria    HPI Whitney [name] is a 81 y.o. female.  Patient presents with dysuria since this morning.  She denies fever, chills, abdominal pain, flank pain, hematuria, or other symptoms.  No treatment at home.  She was seen here on 01/12/2022; diagnosed with UTI; treated with Keflex; Urine culture positive for >100,000 colonies of E. coli.  Her medical history includes colon cancer, metastasis to brain, diabetes, hypertension, liver cirrhosis secondary to NASH, portal hypertension, stroke.  Hospitalized at [facility] on 02/19/2022 for dizziness, brain lesion, history of CVA, osteoarthritis.     The history is provided by the patient and medical records.    Past Medical History:  Diagnosis Date   Allergy    Cancer [facility]) 2007   Colon Cancer stage 1  surgery only.  no chemo or xrt.   Diabetes mellitus without complication ([place])    Fibromuscular dysplasia ([place])    Hypertension    NASH (nonalcoholic steatohepatitis)    Stroke [facility])     Patient Active Problem List   Diagnosis Date Noted   Dysthymia 04/12/2019   OSA on CPAP 04/12/2019   Osteopenia 04/12/2019   Spondylosis without myelopathy or radiculopathy, lumbar region 04/12/2019   Myofascial pain syndrome 04/12/2019   Chronic right-sided low back pain without sciatica 12/16/2018   Osteoarthritis of carpometacarpal [facility]) joint of thumb 07/25/2018   Liver cirrhosis secondary to NASH ([place]) 10/08/2015   Diabetes mellitus type 2, controlled, without complications ([place]) 56/38/7564   Vitamin D deficiency 03/29/2013   Hyperlipidemia, unspecified 05/06/2011   NAFLD (nonalcoholic fatty liver disease) 05/06/2011   Obesity (BMI 30-39.9) 05/06/2011   VULVAR VESTIBULITIS 07/30/2009   HTN (hypertension) 07/07/2007   H/O colon cancer, stage I 07/06/2005    Past Surgical History:  Procedure  Laterality Date   BREAST EXCISIONAL BIOPSY Bilateral 60's, 70's, 80's   Benign    OB History   No obstetric history on file.      Home Medications    Prior to Admission medications   Medication Sig Start Date End Date Taking? Authorizing Provider  amLODipine (NORVASC) 10 MG tablet Take 10 mg by mouth daily. 12/31/21   [provider]  aspirin EC 81 MG tablet Take by mouth.    [provider]  Blood Glucose Monitoring Suppl (FIFTY50 GLUCOSE METER 2.0) w/Device KIT Frequency:PHARMDIR   Dosage:0.0     Instructions:  Note:microfill contour strips BID Dose: 1 09/21/12   [provider]  carvedilol (COREG) 6.25 MG tablet Take 6.25 mg by mouth 2 (two) times daily. 12/31/21   [provider]  Cholecalciferol (VITAMIN D3) 2000 UNITS capsule Take by mouth.    [provider]  escitalopram (LEXAPRO) 10 MG tablet escitalopram 10 mg tablet 09/02/18   [provider]  ferrous sulfate 325 (65 FE) MG tablet Take by mouth.    [provider]  fluticasone (FLONASE) 50 MCG/ACT nasal spray Place into the nose. 10/15/10   [provider]  lisinopril (ZESTRIL) 40 MG tablet Take 40 mg by mouth daily. 12/31/21   [provider]  meclizine (ANTIVERT) 25 MG tablet Take 25 mg by mouth every 8 (eight) hours as needed. 01/14/22   [provider]  methocarbamol (ROBAXIN) 500 MG tablet TAKE 1 TABLET BY MOUTH FOUR TIMES A DAY AS NEEDED 12/05/18  [provider]  omeprazole (PRILOSEC) 20 MG capsule Take 20 mg by mouth daily. 12/02/21   [provider]  pravastatin (PRAVACHOL) 10 MG tablet pravastatin 10 mg tablet 12/05/15   [provider]  spironolactone (ALDACTONE) 25 MG tablet Take 25 mg by mouth daily. 02/09/22   [provider]  traZODone (DESYREL) 50 MG tablet  12/18/18   [provider]    Family History Family History  Problem Relation Age of Onset   Breast cancer Neg Hx     Social  History Social History   Tobacco Use   Smoking status: Former    Types: Cigarettes    Quit date: 07/18/1975    Years since quitting: 46.6   Smokeless tobacco: Never  Vaping Use   Vaping Use: Never used  Substance Use Topics   Alcohol use: Yes   Drug use: No     Allergies   Gluten meal, Lactose, Oxycodone, Propoxyphene, Sulfa antibiotics, Chlorthalidone, Codeine, Epinephrine, Ibuprofen, Iohexol, Penicillins, Propoxyphene n-acetaminophen, Tramadol, Dexamethasone, Hydrocodone, and Nitrofurantoin   Review of Systems Review of Systems  Constitutional:  Negative for chills and fever.  Gastrointestinal:  Negative for abdominal pain, diarrhea and vomiting.  Genitourinary:  Positive for dysuria. Negative for flank pain, hematuria and pelvic pain.  Skin:  Negative for color change and rash.  All other systems reviewed and are negative.    Physical Exam Triage Vital Signs ED Triage Vitals [02/23/22 1335]  Enc Vitals Group     BP (!) 160/76     Pulse Rate 85     Resp 18     Temp 98.6 F (37 C)     Temp src      SpO2 97 %     Weight 141 lb 12.8 oz (64.3 kg)     Height 5' 5.5" (1.664 m)     Head Circumference      Peak Flow      Pain Score 0     Pain Loc      Pain Edu?      Excl. in [place]?    No data found.  Updated Vital Signs BP 133/73   Pulse 85   Temp 98.6 F (37 C)   Resp 18   Ht 5' 5.5" (1.664 m)   Wt 141 lb 12.8 oz (64.3 kg)   SpO2 97%   BMI 23.24 kg/m   Visual Acuity Right Eye Distance:   Left Eye Distance:   Bilateral Distance:    Right Eye Near:   Left Eye Near:    Bilateral Near:     Physical Exam Vitals and nursing note reviewed.  Constitutional:      General: She is not in acute distress.    Appearance: She is well-developed. She is not ill-appearing.  HENT:     Mouth/Throat:     Mouth: Mucous membranes are moist.  Cardiovascular:     Rate and Rhythm: Normal rate and regular rhythm.     Heart sounds: Normal heart sounds.  Pulmonary:      Effort: Pulmonary effort is normal. No respiratory distress.     Breath sounds: Normal breath sounds.  Abdominal:     General: Bowel sounds are normal.     Palpations: Abdomen is soft.     Tenderness: There is no abdominal tenderness. There is no right CVA tenderness, left CVA tenderness, guarding or rebound.  Musculoskeletal:     Cervical back: Neck supple.  Skin:    General: Skin is warm  and dry.  Neurological:     Mental Status: She is alert.  Psychiatric:        Mood and Affect: Mood normal.        Behavior: Behavior normal.      UC Treatments / Results  Labs (all labs ordered are listed, but only abnormal results are displayed) Labs Reviewed  POCT URINALYSIS DIP (MANUAL ENTRY) - Abnormal; Notable for the following components:      Result Value   pH, UA 8.5 (*)    Protein Ur, POC trace (*)    All other components within normal limits  URINE CULTURE    EKG   Radiology No results found.  Procedures Procedures (including critical care time)  Medications Ordered in UC Medications - No data to display  Initial Impression / Assessment and Plan / UC Course  I have reviewed the triage vital signs and the nursing notes.  Pertinent labs & imaging results that were available during my care of the patient were reviewed by me and considered in my medical decision making (see chart for details).    Dysuria.  Urine culture pending.  Discussed with patient that we will call her if the culture shows the need for treatment.  Instructed her to follow-up with her PCP.  She agrees to plan of care.  Final Clinical Impressions(s) / UC Diagnoses   Final diagnoses:  Dysuria     Discharge Instructions      The urine culture is pending.  We will call you if it shows the need to for treatment.  Follow up with your primary care provider if your symptoms are not improving.         ED Prescriptions   None    PDMP not reviewed this encounter.   Sharion Balloon,  NP 02/23/22 1408

## 2022-02-23 NOTE — ED Triage Notes (Addendum)
Patient to Urgent Care with complaints of dysuria this morning. Denies any vaginal discharge.   Reports that she was recently treated for a UTI several weeks ago.

## 2022-04-19 DIAGNOSIS — I773 Arterial fibromuscular dysplasia: Secondary | ICD-10-CM | POA: Insufficient documentation

## 2022-04-29 ENCOUNTER — Emergency Department: Payer: Medicare Other

## 2022-04-29 ENCOUNTER — Encounter: Payer: Self-pay | Admitting: Internal Medicine

## 2022-04-29 ENCOUNTER — Inpatient Hospital Stay
Admission: EM | Admit: 2022-04-29 | Discharge: 2022-05-01 | DRG: 100 | Disposition: A | Discharge status: Home Health | Payer: Medicare Other | Attending: Internal Medicine | Admitting: Internal Medicine

## 2022-04-29 ENCOUNTER — Other Ambulatory Visit: Payer: Self-pay

## 2022-04-29 DIAGNOSIS — E871 Hypo-osmolality and hyponatremia: Secondary | ICD-10-CM | POA: Diagnosis present

## 2022-04-29 DIAGNOSIS — Z87891 Personal history of nicotine dependence: Secondary | ICD-10-CM

## 2022-04-29 DIAGNOSIS — K7460 Unspecified cirrhosis of liver: Secondary | ICD-10-CM | POA: Diagnosis present

## 2022-04-29 DIAGNOSIS — R911 Solitary pulmonary nodule: Secondary | ICD-10-CM | POA: Diagnosis present

## 2022-04-29 DIAGNOSIS — I10 Essential (primary) hypertension: Secondary | ICD-10-CM

## 2022-04-29 DIAGNOSIS — Z85038 Personal history of other malignant neoplasm of large intestine: Secondary | ICD-10-CM

## 2022-04-29 DIAGNOSIS — E1140 Type 2 diabetes mellitus with diabetic neuropathy, unspecified: Secondary | ICD-10-CM | POA: Diagnosis present

## 2022-04-29 DIAGNOSIS — I619 Nontraumatic intracerebral hemorrhage, unspecified: Secondary | ICD-10-CM | POA: Diagnosis present

## 2022-04-29 DIAGNOSIS — Z91011 Allergy to milk products: Secondary | ICD-10-CM

## 2022-04-29 DIAGNOSIS — Z888 Allergy status to other drugs, medicaments and biological substances status: Secondary | ICD-10-CM

## 2022-04-29 DIAGNOSIS — E119 Type 2 diabetes mellitus without complications: Secondary | ICD-10-CM

## 2022-04-29 DIAGNOSIS — G4733 Obstructive sleep apnea (adult) (pediatric): Secondary | ICD-10-CM | POA: Diagnosis present

## 2022-04-29 DIAGNOSIS — Z880 Allergy status to penicillin: Secondary | ICD-10-CM

## 2022-04-29 DIAGNOSIS — Z886 Allergy status to analgesic agent status: Secondary | ICD-10-CM | POA: Diagnosis not present

## 2022-04-29 DIAGNOSIS — G936 Cerebral edema: Secondary | ICD-10-CM | POA: Diagnosis present

## 2022-04-29 DIAGNOSIS — Z91041 Radiographic dye allergy status: Secondary | ICD-10-CM

## 2022-04-29 DIAGNOSIS — E041 Nontoxic single thyroid nodule: Secondary | ICD-10-CM | POA: Diagnosis present

## 2022-04-29 DIAGNOSIS — G8321 Monoplegia of upper limb affecting right dominant side: Secondary | ICD-10-CM | POA: Diagnosis present

## 2022-04-29 DIAGNOSIS — Z7982 Long term (current) use of aspirin: Secondary | ICD-10-CM

## 2022-04-29 DIAGNOSIS — D61818 Other pancytopenia: Secondary | ICD-10-CM | POA: Diagnosis present

## 2022-04-29 DIAGNOSIS — K7581 Nonalcoholic steatohepatitis (NASH): Secondary | ICD-10-CM | POA: Diagnosis present

## 2022-04-29 DIAGNOSIS — R29708 NIHSS score 8: Secondary | ICD-10-CM | POA: Diagnosis present

## 2022-04-29 DIAGNOSIS — Z7984 Long term (current) use of oral hypoglycemic drugs: Secondary | ICD-10-CM

## 2022-04-29 DIAGNOSIS — Z882 Allergy status to sulfonamides status: Secondary | ICD-10-CM

## 2022-04-29 DIAGNOSIS — R4701 Aphasia: Secondary | ICD-10-CM | POA: Diagnosis present

## 2022-04-29 DIAGNOSIS — I700 Atherosclerosis of aorta: Secondary | ICD-10-CM | POA: Diagnosis present

## 2022-04-29 DIAGNOSIS — Z885 Allergy status to narcotic agent status: Secondary | ICD-10-CM

## 2022-04-29 DIAGNOSIS — G4700 Insomnia, unspecified: Secondary | ICD-10-CM | POA: Diagnosis present

## 2022-04-29 DIAGNOSIS — R569 Unspecified convulsions: Secondary | ICD-10-CM | POA: Diagnosis present

## 2022-04-29 DIAGNOSIS — E785 Hyperlipidemia, unspecified: Secondary | ICD-10-CM | POA: Diagnosis present

## 2022-04-29 DIAGNOSIS — Z79899 Other long term (current) drug therapy: Secondary | ICD-10-CM

## 2022-04-29 DIAGNOSIS — G939 Disorder of brain, unspecified: Secondary | ICD-10-CM

## 2022-04-29 DIAGNOSIS — Q279 Congenital malformation of peripheral vascular system, unspecified: Secondary | ICD-10-CM | POA: Diagnosis not present

## 2022-04-29 DIAGNOSIS — Z8673 Personal history of transient ischemic attack (TIA), and cerebral infarction without residual deficits: Secondary | ICD-10-CM

## 2022-04-29 DIAGNOSIS — Z79891 Long term (current) use of opiate analgesic: Secondary | ICD-10-CM

## 2022-04-29 DIAGNOSIS — H547 Unspecified visual loss: Secondary | ICD-10-CM | POA: Diagnosis present

## 2022-04-29 DIAGNOSIS — Z91018 Allergy to other foods: Secondary | ICD-10-CM

## 2022-04-29 DIAGNOSIS — Z79890 Hormone replacement therapy (postmenopausal): Secondary | ICD-10-CM

## 2022-04-29 LAB — DIFFERENTIAL
Abs Immature Granulocytes: 0.02 10*3/uL (ref 0.00–0.07)
Basophils Absolute: 0 10*3/uL (ref 0.0–0.1)
Basophils Relative: 1 %
Eosinophils Absolute: 0.1 10*3/uL (ref 0.0–0.5)
Eosinophils Relative: 3 %
Immature Granulocytes: 1 %
Lymphocytes Relative: 32 %
Lymphs Abs: 1.2 10*3/uL (ref 0.7–4.0)
Monocytes Absolute: 0.3 10*3/uL (ref 0.1–1.0)
Monocytes Relative: 9 %
Neutro Abs: 2 10*3/uL (ref 1.7–7.7)
Neutrophils Relative %: 54 %

## 2022-04-29 LAB — CBG MONITORING, ED: Glucose-Capillary: 197 mg/dL — ABNORMAL HIGH (ref 70–99)

## 2022-04-29 LAB — COMPREHENSIVE METABOLIC PANEL
ALT: 24 U/L (ref 0–44)
AST: 41 U/L (ref 15–41)
Albumin: 4.1 g/dL (ref 3.5–5.0)
Alkaline Phosphatase: 53 U/L (ref 38–126)
Anion gap: 11 (ref 5–15)
BUN: 10 mg/dL (ref 8–23)
CO2: 20 mmol/L — ABNORMAL LOW (ref 22–32)
Calcium: 9.5 mg/dL (ref 8.9–10.3)
Chloride: 105 mmol/L (ref 98–111)
Creatinine, Ser: 0.6 mg/dL (ref 0.44–1.00)
GFR, Estimated: >60 mL/min (ref >60)
Glucose, Bld: 169 mg/dL — ABNORMAL HIGH (ref 70–99)
Potassium: 4.6 mmol/L (ref 3.5–5.1)
Sodium: 136 mmol/L (ref 135–145)
Total Bilirubin: 0.9 mg/dL (ref 0.3–1.2)
Total Protein: 7.4 g/dL (ref 6.5–8.1)

## 2022-04-29 LAB — PROTIME-INR
INR: 1.1 (ref 0.8–1.2)
Prothrombin Time: 14.3 seconds (ref 11.4–15.2)

## 2022-04-29 LAB — CBC
HCT: 35.2 % — ABNORMAL LOW (ref 36.0–46.0)
Hemoglobin: 11.6 g/dL — ABNORMAL LOW (ref 12.0–15.0)
MCH: 28.6 pg (ref 26.0–34.0)
MCHC: 33 g/dL (ref 30.0–36.0)
MCV: 86.7 fL (ref 80.0–100.0)
Platelets: 101 10*3/uL — ABNORMAL LOW (ref 150–400)
RBC: 4.06 MIL/uL (ref 3.87–5.11)
RDW: 13 % (ref 11.5–15.5)
WBC: 3.7 10*3/uL — ABNORMAL LOW (ref 4.0–10.5)
nRBC: 0 % (ref 0.0–0.2)

## 2022-04-29 LAB — APTT: aPTT: 42 seconds — ABNORMAL HIGH (ref 24–36)

## 2022-04-29 LAB — HEMOGLOBIN A1C
Hgb A1c MFr Bld: 5.7 % — ABNORMAL HIGH (ref 4.8–5.6)
Mean Plasma Glucose: 116.89 mg/dL

## 2022-04-29 LAB — GLUCOSE, CAPILLARY: Glucose-Capillary: 145 mg/dL — ABNORMAL HIGH (ref 70–99)

## 2022-04-29 LAB — MRSA NEXT GEN BY PCR, NASAL: MRSA by PCR Next Gen: NOT DETECTED

## 2022-04-29 LAB — AMMONIA: Ammonia: 20 umol/L (ref 9–35)

## 2022-04-29 MED ORDER — GABAPENTIN 300 MG PO CAPS
300.0000 mg | ORAL_CAPSULE | Freq: Three times a day (TID) | ORAL | Status: DC
  Administered 2022-04-29 – 2022-05-01 (×5): 300 mg via ORAL
  Filled 2022-04-29 (×5): qty 1

## 2022-04-29 MED ORDER — DIPHENHYDRAMINE HCL 50 MG/ML IJ SOLN: 50.0000 mg | INTRAMUSCULAR | Status: DC

## 2022-04-29 MED ORDER — SODIUM CHLORIDE 0.9 % IV SOLN: 2000.0000 mg | Freq: Once | INTRAVENOUS | Status: DC

## 2022-04-29 MED ORDER — ORAL CARE MOUTH RINSE
15.0000 mL | OROMUCOSAL | Status: DC | PRN
  Filled 2022-04-29: qty 15

## 2022-04-29 MED ORDER — GADOBUTROL 1 MMOL/ML IV SOLN
6.0000 mL | Freq: Once | INTRAVENOUS | Status: AC | PRN
  Administered 2022-04-29: 6 mL via INTRAVENOUS

## 2022-04-29 MED ORDER — LEVETIRACETAM IN NACL 500 MG/100ML IV SOLN
500.0000 mg | Freq: Two times a day (BID) | INTRAVENOUS | Status: DC
  Administered 2022-04-29 – 2022-05-01 (×4): 500 mg via INTRAVENOUS
  Filled 2022-04-29 (×5): qty 100

## 2022-04-29 MED ORDER — CHLORHEXIDINE GLUCONATE CLOTH 2 % EX PADS: 6.0000 | MEDICATED_PAD | Freq: Every day | CUTANEOUS | Status: DC

## 2022-04-29 MED ORDER — CARVEDILOL 12.5 MG PO TABS
12.5000 mg | ORAL_TABLET | Freq: Two times a day (BID) | ORAL | Status: DC
  Administered 2022-04-30 – 2022-05-01 (×3): 12.5 mg via ORAL
  Filled 2022-04-29 (×3): qty 1

## 2022-04-29 MED ORDER — SENNOSIDES-DOCUSATE SODIUM 8.6-50 MG PO TABS
1.0000 | ORAL_TABLET | Freq: Two times a day (BID) | ORAL | Status: DC
  Administered 2022-04-29 – 2022-04-30 (×2): 1 via ORAL
  Filled 2022-04-29 (×4): qty 1

## 2022-04-29 MED ORDER — LOSARTAN POTASSIUM 50 MG PO TABS
50.0000 mg | ORAL_TABLET | Freq: Every day | ORAL | Status: DC
  Administered 2022-04-29 – 2022-05-01 (×3): 50 mg via ORAL
  Filled 2022-04-29 (×3): qty 1

## 2022-04-29 MED ORDER — LORAZEPAM 2 MG/ML IJ SOLN: 1.0000 mg | Freq: Once | INTRAMUSCULAR | Status: DC

## 2022-04-29 MED ORDER — TRAZODONE HCL 50 MG PO TABS
50.0000 mg | ORAL_TABLET | Freq: Every day | ORAL | Status: DC
  Administered 2022-04-29 – 2022-04-30 (×2): 50 mg via ORAL
  Filled 2022-04-29 (×2): qty 1

## 2022-04-29 MED ORDER — PREDNISONE 20 MG PO TABS
50.0000 mg | ORAL_TABLET | Freq: Four times a day (QID) | ORAL | Status: AC
  Administered 2022-04-29 – 2022-04-30 (×3): 50 mg via ORAL
  Filled 2022-04-29 (×3): qty 1

## 2022-04-29 MED ORDER — FOLIC ACID 1 MG PO TABS
1.0000 mg | ORAL_TABLET | Freq: Every day | ORAL | Status: DC
  Administered 2022-04-29 – 2022-05-01 (×3): 1 mg via ORAL
  Filled 2022-04-29 (×3): qty 1

## 2022-04-29 MED ORDER — THIAMINE MONONITRATE 100 MG PO TABS
100.0000 mg | ORAL_TABLET | Freq: Every day | ORAL | Status: DC
  Administered 2022-04-29 – 2022-05-01 (×3): 100 mg via ORAL
  Filled 2022-04-29 (×3): qty 1

## 2022-04-29 MED ORDER — AMLODIPINE BESYLATE 5 MG PO TABS
5.0000 mg | ORAL_TABLET | Freq: Once | ORAL | Status: AC
  Administered 2022-04-29: 5 mg via ORAL
  Filled 2022-04-29: qty 1

## 2022-04-29 MED ORDER — NICARDIPINE HCL IN NACL 20-0.86 MG/200ML-% IV SOLN
3.0000 mg/h | INTRAVENOUS | Status: DC
  Filled 2022-04-29: qty 200

## 2022-04-29 MED ORDER — LEVETIRACETAM IN NACL 1000 MG/100ML IV SOLN
1000.0000 mg | Freq: Once | INTRAVENOUS | Status: AC
  Administered 2022-04-29: 1000 mg via INTRAVENOUS
  Filled 2022-04-29: qty 100

## 2022-04-29 MED ORDER — ORAL CARE MOUTH RINSE
15.0000 mL | OROMUCOSAL | Status: DC
  Administered 2022-04-29 – 2022-04-30 (×2): 15 mL via OROMUCOSAL
  Filled 2022-04-29 (×6): qty 15

## 2022-04-29 MED ORDER — DIPHENHYDRAMINE HCL 50 MG PO CAPS
50.0000 mg | ORAL_CAPSULE | Freq: Once | ORAL | Status: AC
  Filled 2022-04-29: qty 1

## 2022-04-29 MED ORDER — LORAZEPAM 0.5 MG PO TABS
0.5000 mg | ORAL_TABLET | Freq: Once | ORAL | Status: AC
  Administered 2022-04-29: 0.5 mg via ORAL
  Filled 2022-04-29: qty 1

## 2022-04-29 MED ORDER — FERROUS SULFATE 325 (65 FE) MG PO TABS
325.0000 mg | ORAL_TABLET | Freq: Every day | ORAL | Status: DC
  Administered 2022-04-30 – 2022-05-01 (×2): 325 mg via ORAL
  Filled 2022-04-29 (×2): qty 1

## 2022-04-29 MED ORDER — INSULIN ASPART 100 UNIT/ML IJ SOLN
0.0000 [IU] | Freq: Three times a day (TID) | INTRAMUSCULAR | Status: DC
  Administered 2022-04-30: 8 [IU] via SUBCUTANEOUS
  Administered 2022-04-30: 3 [IU] via SUBCUTANEOUS
  Administered 2022-04-30: 5 [IU] via SUBCUTANEOUS
  Administered 2022-05-01: 3 [IU] via SUBCUTANEOUS
  Filled 2022-04-29 (×4): qty 1

## 2022-04-29 MED ORDER — STROKE: EARLY STAGES OF RECOVERY BOOK: Freq: Once | Status: AC

## 2022-04-29 MED ORDER — DIPHENHYDRAMINE HCL 50 MG PO CAPS: 50.0000 mg | ORAL_CAPSULE | ORAL | Status: DC

## 2022-04-29 MED ORDER — AMLODIPINE BESYLATE 10 MG PO TABS
10.0000 mg | ORAL_TABLET | Freq: Every day | ORAL | Status: DC
  Administered 2022-04-30: 10 mg via ORAL
  Filled 2022-04-29: qty 1

## 2022-04-29 MED ORDER — DIPHENHYDRAMINE HCL 50 MG/ML IJ SOLN
50.0000 mg | Freq: Once | INTRAMUSCULAR | Status: AC
  Administered 2022-04-30: 50 mg via INTRAVENOUS
  Filled 2022-04-29: qty 1

## 2022-04-29 MED ORDER — PRAVASTATIN SODIUM 20 MG PO TABS
10.0000 mg | ORAL_TABLET | Freq: Every day | ORAL | Status: DC
  Administered 2022-04-29 – 2022-05-01 (×3): 10 mg via ORAL
  Filled 2022-04-29 (×3): qty 1

## 2022-04-29 MED ORDER — LORAZEPAM 2 MG/ML IJ SOLN: 0.5000 mg | INTRAMUSCULAR | Status: DC | PRN

## 2022-04-29 MED ORDER — ADULT MULTIVITAMIN W/MINERALS CH
1.0000 | ORAL_TABLET | Freq: Every day | ORAL | Status: DC
  Administered 2022-04-29 – 2022-05-01 (×3): 1 via ORAL
  Filled 2022-04-29 (×3): qty 1

## 2022-04-29 MED ORDER — CARVEDILOL 6.25 MG PO TABS
6.2500 mg | ORAL_TABLET | Freq: Two times a day (BID) | ORAL | Status: DC
  Administered 2022-04-29: 6.25 mg via ORAL
  Filled 2022-04-29: qty 1

## 2022-04-29 MED ORDER — AMLODIPINE BESYLATE 5 MG PO TABS
5.0000 mg | ORAL_TABLET | Freq: Every day | ORAL | Status: AC
  Administered 2022-04-29: 5 mg via ORAL
  Filled 2022-04-29: qty 1

## 2022-04-29 MED ORDER — CARVEDILOL 6.25 MG PO TABS: 6.2500 mg | ORAL_TABLET | Freq: Two times a day (BID) | ORAL | Status: DC

## 2022-04-29 MED ORDER — PANTOPRAZOLE SODIUM 40 MG IV SOLR
40.0000 mg | Freq: Every day | INTRAVENOUS | Status: DC
  Administered 2022-04-29 – 2022-04-30 (×2): 40 mg via INTRAVENOUS
  Filled 2022-04-29 (×2): qty 10

## 2022-04-29 MED ORDER — ESCITALOPRAM OXALATE 10 MG PO TABS
10.0000 mg | ORAL_TABLET | Freq: Every day | ORAL | Status: DC
  Administered 2022-04-29 – 2022-05-01 (×3): 10 mg via ORAL
  Filled 2022-04-29 (×3): qty 1

## 2022-04-29 NOTE — ED Notes (Signed)
Called [facility] spoke to [employer] for transfer,  images powershared [phone]

## 2022-04-29 NOTE — Assessment & Plan Note (Signed)
Per chart review, patient was diagnosed with multifocal lesions within the supratentorial and infratentorial brain consistent with hemorrhagic lesions multiple hemorrhagic lesions involving the supratentorial and infratentorial brain after experiencing dizziness.  She has since established with vascular neurology at [facility].  Biopsy was obtained that was negative for vasculitis or malignancy.  CT chest/abdomen/pelvis and PET scan was negative for malignancy.  Per Dr. Beverely Risen note, differential at this time includes ischemic stroke with hemorrhagic transformation versus multiple cavernoma's.  CT and MRI obtained on this admission demonstrates additional lesions.  - Neurology following and appreciate their recommendations - Maintain SBP less than 130 - No pharmacological DVT prophylaxis -Repeat CT head shows stable findings from before -CT chest/abdomen/pelvis repeated today shows no obvious lesions suggestive of primary malignancy there -I have placed her on a transfer list at [place] per family request although she may not likely need this as she is clinically doing well.  She also has outpatient neurosurgery appointment scheduled with Dr. Cathleen Fears next Tuesday (appreciate Dr. Rhea Bleacher assistance in coordinating this).  If she continues to do well clinically she may be potentially go home tomorrow with outpatient follow-up with Dr. Lacinda Axon next week on Tuesday

## 2022-04-29 NOTE — H&P (Signed)
History and Physical    Patient: Whitney [name] GGY:[id number] DOB: July 21, 1940 DOA: 04/29/2022 DOS: the patient was seen and examined on 04/29/2022 PCP: Glean Salvo, MD  Patient coming from: Home  Chief Complaint:  Chief Complaint  Patient presents with   Tremors    Tremors started 0630 am right arm    HPI: Whitney [name] is a 81 y.o. female with medical history significant of multifocal supra and infratentorial hemorrhagic lesions, colon cancer, type 2 diabetes, cirrhosis secondary to NASH, CVA, hypertension, hyperlipidemia, who presents to the ED with right arm shaking  [name] states she was in her usual state of health this morning when initially getting up at 6:30 AM.  Shortly thereafter, she developed sudden onset severe shaking of the right arm that she was unable to control.  Due to this, EMS was called.  [name] denies any fever, chills, dizziness, focal weakness, headaches, vision changes, palpitations, chest pain, shortness of breath prior to symptom onset.  After receiving Ativan in the ambulance, her symptoms improved.  Once the shaking stopped, she notes residual right hand and wrist weakness.  This was not present before hand.  Mrs. [name] states she has a known history of hemorrhagic lesions in her brain for which she has not recently established with vascular neurology at [facility].  Work-up included biopsy that demonstrated hematoma products only.  Etiology at this time is unknown.  She is having extensive work-up via neurology and hematology.  ED course: On arrival to the ED, patient was hypertensive at 146/62 with heart rate of 97. Initial blood work remarkable for bicarb of 20, glucose of 169, white blood count of 3.7, hemoglobin 11.6, platelets of 101.  CT head was obtained that demonstrates findings consistent with cerebral metastatic disease with some regression of prior lesions, however 5-6 new lesions now noted.  MRI was subsequently obtained that  demonstrated numerous hemorrhagic lesions throughout the brain suspicious for hemorrhagic metastasis.  No evidence of dural venous sinus thrombus.  Neurology has been consulted and evaluated patient.  Initial plan was to have patient transferred to [facility] or [facility]; [facility] is unable to accept patients at this time.  Dr. Curly Shores spoke with neurology at [facility], who feels that she is stable to stay at [facility] given resolution of symptoms.  [facility] contacted for admission.  Review of Systems: As mentioned in the history of present illness. All other systems reviewed and are negative. Past Medical History:  Diagnosis Date   Allergy    Cancer [facility]) 2007   Colon Cancer stage 1  surgery only.  no chemo or xrt.   Diabetes mellitus without complication ([place])    Fibromuscular dysplasia (HCC)    Hypertension    NASH (nonalcoholic steatohepatitis)    Stroke [facility])    Past Surgical History:  Procedure Laterality Date   BREAST EXCISIONAL BIOPSY Bilateral 60's, 70's, 80's   Benign   Social History:  reports that she quit smoking about 46 years ago. Her smoking use included cigarettes. She has never used smokeless tobacco. She reports current alcohol use. She reports that she does not use drugs.  Allergies  Allergen Reactions   Gluten Meal Diarrhea and Other (See Comments)    Sensitivity to Gluten Sensitivity to Gluten    Lactose Other (See Comments)    Lactose Intolerance  Lactose intolerance diarrhea.   Oxycodone Itching   Propoxyphene Other (See Comments)    REACTION: caused BP to elevate Other Reaction: CNS Disorder REACTION: caused BP  to elevate REACTION: caused BP to elevate Other Reaction: CNS Disorder    Sulfa Antibiotics Rash   Chlorthalidone Other (See Comments)    Hyponatremia Hyponatremia Other reaction(s): Other (see comments) Hyponatremia Hyponatremia More of a intolerance per pt  Hyponatremia Hyponatremia More of a intolerance per pt  Hyponatremia Hyponatremia    Codeine  Itching   Epinephrine     REACTION: causes BP to elevate   Ibuprofen     REACTION: causes BP to elevate   Iohexol      Code: RASH, Desc: IV CONTRAST REACTION 74YRS AGO, OK TODAY W/ 25MG BENADRYL PO JUST PRIOR TO EXAM/A.C., Onset Date: [id number]    Penicillins     REACTION: rash   Propoxyphene N-Acetaminophen     REACTION: caused BP to elevate   Tramadol     Other reaction(s): Insomnia   Dexamethasone Rash   Hydrocodone Rash   Nitrofurantoin Nausea Only    Nausea/diarrhea Nausea/diarrhea Nausea/diarrhea Nausea/diarrhea     Family History  Problem Relation Age of Onset   Breast cancer Neg Hx     Prior to Admission medications   Medication Sig Start Date End Date Taking? Authorizing Provider  amLODipine (NORVASC) 5 MG tablet Take 5 mg by mouth 2 (two) times daily. 02/24/22  Yes [provider]  carvedilol (COREG) 12.5 MG tablet Take 12.5 mg by mouth 2 (two) times daily. 12/31/21  Yes [provider]  Cholecalciferol (VITAMIN D3) 2000 UNITS capsule Take by mouth.   Yes [provider]  escitalopram (LEXAPRO) 10 MG tablet Take 10 mg by mouth daily. 09/02/18  Yes [provider]  estradiol (ESTRACE) 0.1 MG/GM vaginal cream Place 1 Applicatorful vaginally 2 (two) times a week. 02/24/22  Yes [provider]  ferrous sulfate 325 (65 FE) MG tablet Take 325 mg by mouth daily with breakfast.   Yes [provider]  fluticasone (FLONASE) 50 MCG/ACT nasal spray Place into the nose. 10/15/10  Yes [provider]  losartan (COZAAR) 50 MG tablet Take 50 mg by mouth in the morning and at bedtime. 04/15/22  Yes [provider]  metFORMIN (GLUCOPHAGE-XR) 500 MG 24 hr tablet Take 500-1,000 mg by mouth 2 (two) times daily. 500 mg every morning and 1000 mg every evening 02/09/22  Yes [provider]  Multiple Vitamins-Minerals (MULTIVITAMIN WITH MINERALS) tablet Take 1 tablet by mouth daily.   Yes [provider]   omeprazole (PRILOSEC) 20 MG capsule Take 20 mg by mouth daily. 12/02/21  Yes [provider]  pravastatin (PRAVACHOL) 10 MG tablet Take 10 mg by mouth daily. 12/05/15  Yes [provider]  traZODone (DESYREL) 50 MG tablet Take 50 mg by mouth at bedtime. 12/18/18  Yes [provider]  aspirin EC 81 MG tablet Take by mouth. Patient not taking: Reported on 04/29/2022    [provider]  Blood Glucose Monitoring Suppl (FIFTY50 GLUCOSE METER 2.0) w/Device KIT Frequency:PHARMDIR   Dosage:0.0     Instructions:  Note:microfill contour strips BID Dose: 1 09/21/12   [provider]  fexofenadine (ALLEGRA) 180 MG tablet Take 180 mg by mouth daily as needed for allergies.    [provider]  gabapentin (NEURONTIN) 300 MG capsule Take 300 mg by mouth 3 (three) times daily. 03/02/22   [provider]  lisinopril (ZESTRIL) 40 MG tablet Take 40 mg by mouth daily. Patient not taking: Reported on 04/29/2022 12/31/21   [provider]  meclizine (ANTIVERT) 25 MG tablet Take 25 mg by  mouth every 8 (eight) hours as needed. 01/14/22   [provider]  methocarbamol (ROBAXIN) 500 MG tablet TAKE 1 TABLET BY MOUTH FOUR TIMES A DAY AS NEEDED 12/05/18   [provider]  spironolactone (ALDACTONE) 25 MG tablet Take 25 mg by mouth daily. Patient not taking: Reported on 04/29/2022 02/09/22   [provider]    Physical Exam: Vitals:   04/29/22 1310 04/29/22 1400 04/29/22 1730 04/29/22 1754  BP: (!) 148/60 135/60 115/76   Pulse: 68 69  69  Resp:    20  Temp:  98.4 F (36.9 C)  97.9 F (36.6 C)  TempSrc:  Oral  Oral  SpO2: 97% 98%  96%  Weight:      Height:       Physical Exam Vitals and nursing note reviewed.  Constitutional:      General: She is not in acute distress.    Appearance: She is normal weight. She is not toxic-appearing.  HENT:     Head: Normocephalic and atraumatic.  Cardiovascular:     Rate and Rhythm:  Normal rate and regular rhythm.     Heart sounds: No murmur heard.    No gallop.  Pulmonary:     Effort: Pulmonary effort is normal. No respiratory distress.     Breath sounds: Normal breath sounds. No wheezing, rhonchi or rales.  Abdominal:     General: Bowel sounds are normal. There is no distension.     Palpations: Abdomen is soft.     Tenderness: There is no abdominal tenderness. There is no guarding.  Musculoskeletal:     Right lower leg: No edema.     Left lower leg: No edema.  Skin:    General: Skin is warm and dry.  Neurological:     Mental Status: She is alert and oriented to person, place, and time.     Comments:  Cranial nerves II through X intact.  No facial asymmetry noted  Sensation intact throughout  Left upper extremity: 5 out of 5 strength throughout Left lower extremity: 4 out of 5 strength throughout  Right upper extremity 5 out of 5 strength with shoulder flexion and extension.  2 out of 5 strength with wrist flexion and extension.  Finger grip strength 3 out of 5  Psychiatric:        Mood and Affect: Mood normal.        Behavior: Behavior normal.        Thought Content: Thought content normal.        Judgment: Judgment normal.    Data Reviewed: CBC remarkable for white blood count of 3.7 hemoglobin of 11.6, platelets of 101.  CMP remarkable for glucose of 169, bicarb of 20.  Ammonia within normal limits.  PT/INR within normal limits.  PTT prolonged at 42.  EKG personally reviewed, with significant artifact noted.  Will obtain repeat  MR BRAIN W WO CONTRAST  Result Date: 04/29/2022 CLINICAL DATA:  Stroke, follow up; Stroke, hemorrhagic rule out CVST EXAM: MRI HEAD WITHOUT AND WITH CONTRAST MRV HEAD WITHOUT CONTRAST TECHNIQUE: Multiplanar, multiecho pulse sequences of the brain and surrounding structures were obtained without and with intravenous contrast. Angiographic images of the intracranial venous structures were obtained using MRV technique without  intravenous contrast. CONTRAST:  72m GADAVIST GADOBUTROL 1 MMOL/ML IV SOLN COMPARISON:  CT head from the same day. FINDINGS: MRI head: Brain: There are many hemorrhagic masses within the infratentorial and supratentorial brain with suggestion of some superimposed enhancement although intrinsic  blood products limits assessment for enhancement. Index lesions on series 1040 include: 1.1 cm lesion in the posterior cerebellum (series 1040, image 66) 1.5 cm lesion in the right occipital lobe (image 82). 1.1 cm lesion the right occipital lobe (image 90). 4.5 mm left para hippocampal lesion (image 95). 1.1 cm lesion in the left frontal white matter (image 110). 5 mm lesion in the left frontal cortex (image 112). 3 mm lesion in the right frontal white matter (image 123). 2.0 cm lesion in the right parietal lobe (image 132). 1.3 cm lesion in the left parietal lobe (image 134). 4 mm lesion in the left parietal lobe (image 143). Edema and regional mass effect surrounding these lesions without midline shift. No evidence of acute infarct, midline shift, hydrocephalus. No acute hemorrhage outside of the above lesions. Vascular: Major arterial flow voids are maintained skull base. Skull and upper cervical spine: Prior right posterior craniotomy. Otherwise, normal marrow signal. Sinuses and mastoid air cells: Mild paranasal sinus mucosal thickening. Largely clear mastoid air cells. MRV: No evidence of dural venous sinus thrombosis. The superior sagittal, transverse, sigmoid and straight sinuses are patent. Visualized deep cerebral veins are patent. Scattered small benign arachnoid granulations. IMPRESSION: 1. Numerous hemorrhagic lesions throughout the brain, described above and most suspicious for hemorrhagic metastases. 2. No evidence of dural venous sinus thrombosis. Electronically Signed   By: Margaretha Sheffield M.D.   On: 04/29/2022 13:16   MR MRV HEAD W WO CONTRAST  Result Date: 04/29/2022 CLINICAL DATA:  Stroke, follow  up; Stroke, hemorrhagic rule out CVST EXAM: MRI HEAD WITHOUT AND WITH CONTRAST MRV HEAD WITHOUT CONTRAST TECHNIQUE: Multiplanar, multiecho pulse sequences of the brain and surrounding structures were obtained without and with intravenous contrast. Angiographic images of the intracranial venous structures were obtained using MRV technique without intravenous contrast. CONTRAST:  62m GADAVIST GADOBUTROL 1 MMOL/ML IV SOLN COMPARISON:  CT head from the same day. FINDINGS: MRI head: Brain: There are many hemorrhagic masses within the infratentorial and supratentorial brain with suggestion of some superimposed enhancement although intrinsic blood products limits assessment for enhancement. Index lesions on series 1040 include: 1.1 cm lesion in the posterior cerebellum (series 1040, image 66) 1.5 cm lesion in the right occipital lobe (image 82). 1.1 cm lesion the right occipital lobe (image 90). 4.5 mm left para hippocampal lesion (image 95). 1.1 cm lesion in the left frontal white matter (image 110). 5 mm lesion in the left frontal cortex (image 112). 3 mm lesion in the right frontal white matter (image 123). 2.0 cm lesion in the right parietal lobe (image 132). 1.3 cm lesion in the left parietal lobe (image 134). 4 mm lesion in the left parietal lobe (image 143). Edema and regional mass effect surrounding these lesions without midline shift. No evidence of acute infarct, midline shift, hydrocephalus. No acute hemorrhage outside of the above lesions. Vascular: Major arterial flow voids are maintained skull base. Skull and upper cervical spine: Prior right posterior craniotomy. Otherwise, normal marrow signal. Sinuses and mastoid air cells: Mild paranasal sinus mucosal thickening. Largely clear mastoid air cells. MRV: No evidence of dural venous sinus thrombosis. The superior sagittal, transverse, sigmoid and straight sinuses are patent. Visualized deep cerebral veins are patent. Scattered small benign arachnoid  granulations. IMPRESSION: 1. Numerous hemorrhagic lesions throughout the brain, described above and most suspicious for hemorrhagic metastases. 2. No evidence of dural venous sinus thrombosis. Electronically Signed   By: FMargaretha SheffieldM.D.   On: 04/29/2022 13:16   CT HEAD WO  CONTRAST  Result Date: 04/29/2022 CLINICAL DATA:  81 year old female with seizure like activity right upper extremity. Suspected cerebral metastatic disease by CT in August and history of cancer (liver versus colon according to prior imaging reports). EXAM: CT HEAD WITHOUT CONTRAST TECHNIQUE: Contiguous axial images were obtained from the base of the skull through the vertex without intravenous contrast. RADIATION DOSE REDUCTION: This exam was performed according to the departmental dose-optimization program which includes automated exposure control, adjustment of the mA and/or kV according to patient size and/or use of iterative reconstruction technique. COMPARISON:  Head CT 02/05/2022.  Sinuses FINDINGS: Brain: Scattered hyperdense brain lesions, very similar in appearance although in a different locations now compared to the more than half a dozen lesions identified in August. And of note, a large 2.6 cm lesion of the central of the superior cerebellum in the midline at that time seems resolved, is no longer evident. Now a left perirolandic lesion measures 16 mm on series 3, image 23. Approximately 5 such lesions are identified today ranging from 8 mm to nearly 19 mm diameter. Craniotomy and mild resection changes in the right occipital lobe area of dominant lesions previously. Despite the multiple hyperdense lesions there is only mild associated cerebral edema and no significant intracranial mass effect. No intraventricular or extra-axial hemorrhage. No changes typical of acute cortically based infarct. Vascular: Calcified atherosclerosis at the skull base. No suspicious intracranial vascular hyperdensity. Skull: Small right  posterior craniotomy since August. No other acute osseous finding. Sinuses/Orbits: Visualized paranasal sinuses and mastoids are stable and well aerated. Other: Interval postoperative changes to the right posterior scalp. Visualized orbit soft tissues are within normal limits. IMPRESSION: 1. Again there is strong evidence of cerebral metastatic disease, but with unusual constellation of findings: Marked regression since August of numerous up to 2.6 cm brain lesions, but 5-6 new similar lesions now individually up to 1.9 cm now. Interval craniotomy on the right. And a new left motor strip lesion now is likely associated with the right upper extremity symptoms. This was discussed by telephone with Dr. Merlyn Lot on 04/29/2022 at 0904 hours. 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 2. Relatively mild associated cerebral edema with no significant intracranial mass effect. Electronically Signed   By: Genevie Ann M.D.   On: 04/29/2022 09:13   DG Chest Portable 1 View  Result Date: 04/29/2022 CLINICAL DATA:  Mental status changes. EXAM: PORTABLE CHEST 1 VIEW COMPARISON:  None Available. FINDINGS: Top-normal heart size. Cardiac monitor overlies mid chest. Low lung volumes with bibasilar atelectasis. There is no evidence of pulmonary edema, consolidation, pneumothorax or pleural fluid. Visualized bony structures are unremarkable. IMPRESSION: Low lung volumes with bibasilar atelectasis. Electronically Signed   By: Aletta Edouard M.D.   On: 04/29/2022 08:54    Results are pending, will review when available.  Assessment and Plan: * Seizure ([place]) Focal seizure with right arm tremors in the setting of multiple brain lesions.  Patient was given Ativan with resolution of symptoms and subsequently loaded on Keppra.  High risk for additional seizures.   - Admit to stepdown for every hour neurochecks -  Continue Keppra - Seizure precautions - Ativan as needed for seizure activity lasting more than 5 minutes  Brain lesion Per chart review, patient was diagnosed with multifocal lesions within the supratentorial and infratentorial brain consistent with hemorrhagic lesions multiple hemorrhagic lesions involving the supratentorial and  infratentorial brain after experiencing dizziness.  She has since established with vascular neurology at [facility].  Biopsy was obtained that was negative for vasculitis or malignancy.  CT chest/abdomen/pelvis and PET scan was negative for malignancy.  Per Dr. Beverely Risen note, differential at this time includes ischemic stroke with hemorrhagic transformation versus multiple cavernoma's.  CT and MRI obtained on this admission demonstrates additional lesions.  - Neurology consulted; appreciate their recommendations - Maintain SBP less than 130 - No pharmacological DVT prophylaxis  Pancytopenia ([place]) Patient has a history of anemia, however leukopenia and thrombocytopenia appear new.  Thrombocytopenia can be explained by patient's history of cirrhosis, however uncertain why patient is developed leukopenia.  Patient has already established with hematology at [facility].  - Continue outpatient follow-up with [facility] hematology - Repeat CBC in a.m.  HTN (hypertension) SBP goal less than 130 given hemorrhagic lesions of the brain.  At this time, patient's blood pressure is within goal.  Reviewed most recent cardiology note at which time it was indicated patient is on carvedilol 12.5 mg, losartan 50 mg, and amlodipine 10 mg.  Patient already received 5 mg of amlodipine earlier, so we will give an additional 5 mg tonight.  - Restart home medications  Diabetes mellitus type 2, controlled, without complications (HCC) - Z1I pending - SSI  OSA on CPAP - CPAP ordered   Advance Care Planning:   Code Status: Full Code   Consults: Neurology  Family Communication: Patient's husband  updated at bedside  Severity of Illness: The appropriate patient status for this patient is INPATIENT. Inpatient status is judged to be reasonable and necessary in order to provide the required intensity of service to ensure the patient's safety. The patient's presenting symptoms, physical exam findings, and initial radiographic and laboratory data in the context of their chronic comorbidities is felt to place them at high risk for further clinical deterioration. Furthermore, it is not anticipated that the patient will be medically stable for discharge from the hospital within 2 midnights of admission.   * I certify that at the point of admission it is my clinical judgment that the patient will require inpatient hospital care spanning beyond 2 midnights from the point of admission due to high intensity of service, high risk for further deterioration and high frequency of surveillance required.*  Author: Jose Persia, MD 04/29/2022 6:29 PM  For on call review [email or web address].

## 2022-04-29 NOTE — ED Notes (Signed)
1000 pt up with one assist to commode, RN notice right arm flaccid. MD made aware. Pt states this is new for her, Neuro consult placed.

## 2022-04-29 NOTE — ED Provider Notes (Signed)
[facility] Provider Note    Event Date/Time   First MD Initiated Contact with Patient 04/29/22 [phone]     (approximate)   History   Tremors (Tremors started 0630 am right arm )   HPI  Whitney [name] is a 81 y.o. female extensive past medical history including history of colon cancer, diabetes hypertension cirrhosis secondary to [place] as well as stroke recent hospitalization at [facility] in August presents to the ER for evaluation of shaking spell to the right upper extremity.  This started around 630.  No other associated pain or weakness.  Is never had this happen before.  No history of seizures.  Not on any AEDs.  Denies any pain no nausea or vomiting.  No chest pain.   "On 02/19/2022 she was admitted to the neurosurgical service for a biopsy and had a BrainLab guided right occipital craniotomy for open biopsy and dissection of the right occipital lesion. This showed acute and organizing hematoma with reactive and ischemic brain parenchyma and perivascular mixed inflammation. There was dense fibrous tissue consistent with dura with no pathologic abnormality. The etiology of the hemorrhage could not be identified. There was no definitive evidence of amyloid angiopathy, primary or metastatic neoplasm or vasculitis although these entities were considered. She was seen by Dr. Lacinda Axon in neurosurgery in follow-up who felt that metastatic disease was less likely "   Physical Exam   Triage Vital Signs: ED Triage Vitals  Enc Vitals Group     BP      Pulse      Resp      Temp      Temp src      SpO2      Weight      Height      Head Circumference      Peak Flow      Pain Score      Pain Loc      Pain Edu?      Excl. in [place]?     Most recent vital signs: Vitals:   04/29/22 1310 04/29/22 1400  BP: (!) 148/60 135/60  Pulse: 68 69  Resp:    Temp:  98.4 F (36.9 C)  SpO2: 97% 98%     Constitutional: Alert  Eyes: Conjunctivae are normal.  Head:  Atraumatic. Nose: No congestion/rhinnorhea. Mouth/Throat: Mucous membranes are moist.   Neck: Painless ROM.  Cardiovascular:   Good peripheral circulation. Respiratory: Normal respiratory effort.  No retractions.  Gastrointestinal: Soft and nontender.  Musculoskeletal:  no deformity Neurologic: Rhythmic on distractible shaking of the entire right upper extremity.  No lower extremity involvement.  Sensation intact.  Speaking normal sentences.  Cranial nerves are intact. Skin:  Skin is warm, dry and intact. No rash noted. Psychiatric: Mood and affect are normal. Speech and behavior are normal.    ED Results / Procedures / Treatments   Labs (all labs ordered are listed, but only abnormal results are displayed) Labs Reviewed  APTT - Abnormal; Notable for the following components:      Result Value   aPTT 42 (*)    All other components within normal limits  CBC - Abnormal; Notable for the following components:   WBC 3.7 (*)    Hemoglobin 11.6 (*)    HCT 35.2 (*)    Platelets 101 (*)    All other components within normal limits  COMPREHENSIVE METABOLIC PANEL - Abnormal; Notable for the following components:   CO2 20 (*)  Glucose, Bld 169 (*)    All other components within normal limits  PROTIME-INR  DIFFERENTIAL  AMMONIA  URINE DRUG SCREEN, QUALITATIVE ([facility] ONLY)  URINALYSIS, ROUTINE W REFLEX MICROSCOPIC     EKG  ED ECG REPORT I, Merlyn Lot, the attending physician, personally viewed and interpreted this ECG.   Date: 04/29/2022  EKG Time: 8:09  Rate: 100  Rhythm: sinus  Axis: normal  Intervals:normal qt  ST&T Change: baseline artifact.  Abnml ekg   RADIOLOGY Please see ED Course for my review and interpretation.  I personally reviewed all radiographic images ordered to evaluate for the above acute complaints and reviewed radiology reports and findings.  These findings were personally discussed with the patient.  Please see medical record for radiology  report.    PROCEDURES:  Critical Care performed: Yes, see critical care procedure note(s)  .Critical Care  Performed by: Merlyn Lot, MD Authorized by: Merlyn Lot, MD   Critical care provider statement:    Critical care time (minutes):  34   Critical care was necessary to treat or prevent imminent or life-threatening deterioration of the following conditions:  CNS failure or compromise   Critical care was time spent personally by me on the following activities:  Ordering and performing treatments and interventions, ordering and review of laboratory studies, ordering and review of radiographic studies, pulse oximetry, re-evaluation of patient's condition, review of old charts, obtaining history from patient or surrogate, examination of patient, evaluation of patient's response to treatment, discussions with primary provider, discussions with consultants and development of treatment plan with patient or surrogate    MEDICATIONS ORDERED IN ED: Medications  levETIRAcetam (KEPPRA) IVPB 500 mg/100 mL premix (has no administration in time range)   stroke: early stages of recovery book (has no administration in time range)  senna-docusate (Senokot-S) tablet 1 tablet (1 tablet Oral Given 04/29/22 1314)  pantoprazole (PROTONIX) injection 40 mg (has no administration in time range)  carvedilol (COREG) tablet 6.25 mg (6.25 mg Oral Given 04/29/22 1415)  LORazepam (ATIVAN) tablet 0.5 mg (0.5 mg Oral Given 04/29/22 0821)  levETIRAcetam (KEPPRA) IVPB 1000 mg/100 mL premix (0 mg Intravenous Stopped 04/29/22 1121)  gadobutrol (GADAVIST) 1 MMOL/ML injection 6 mL (6 mLs Intravenous Contrast Given 04/29/22 1301)  amLODipine (NORVASC) tablet 5 mg (5 mg Oral Given 04/29/22 1315)     IMPRESSION / MDM / [place] / ED COURSE  I reviewed the triage vital signs and the nursing notes.                              Differential diagnosis includes, but is not limited to, seizure,  hemorrhage, IVH, ICH, CVA, electrolyte abnormality, hyperammonemia,  Patient presenting to the ER for evaluation of symptoms as described above.  Based on symptoms, risk factors and considered above differential, this presenting complaint could reflect a potentially life-threatening illness therefore the patient will be placed on continuous pulse oximetry and telemetry for monitoring.  Laboratory evaluation will be sent to evaluate for the above complaints.  She is protecting her airway following commands.  Concerned that she is having focal seizures will trial Ativan.  Will order imaging.    Clinical Course as of 04/29/22 1455  Wed Apr 29, 2022  0850 CT imaging my review shows interval development of new hemorrhage suspecting that will await formal radiology report. [PR]  [phone] Assessment the focal seizures have resolved.  Have consulted neurology who will evaluate patient at  bedside. [PR]  1119 Patient has been evaluated by neurology at bedside.  Blood pressure is trending upwards again will place on Cardene for blood pressure control as she does have evidence of hemorrhagic CVA.  Has recommended transfer to tertiary facility.  We will reach out to do consents for most of her care has been provided.  She remains alert and oriented. [PR]  [place] is on diversion due to capacity.  On further review and discussion with Dr. Curly Shores and after review of MRI findings felt to be suspicious for hemorrhagic mets.  Dr. Curly Shores is comfortable with patient being admitted to this facility to stepdown unit for further work-up and management.  Will consult hospitalist for admission. [PR]    Clinical Course User Index [PR] Merlyn Lot, MD    FINAL CLINICAL IMPRESSION(S) / ED DIAGNOSES   Final diagnoses:  Hemorrhagic cerebrovascular accident (CVA) ([place])  Focal seizure ([place])     Rx / DC Orders   ED Discharge Orders     None        Note:  This document was prepared using Dragon voice  recognition software and may include unintentional dictation errors.    Merlyn Lot, MD 04/29/22 1455

## 2022-04-29 NOTE — Assessment & Plan Note (Signed)
Patient has a history of anemia, however leukopenia and thrombocytopenia appear new.  Thrombocytopenia can be explained by patient's history of cirrhosis, however uncertain why patient is developed leukopenia.  Patient has already established with hematology at [facility].  - Continue outpatient follow-up with [facility] hematology - Repeat CBC in a.m.

## 2022-04-29 NOTE — Progress Notes (Signed)
Patient states she has not worn CPAP/BIPAP for years and does not want to wear hospital unit.

## 2022-04-29 NOTE — ED Triage Notes (Signed)
Pt arrives via EMS, states 0630 am this morning her right arm just started to shake. Unable to control movement. States this happened earlier this year to the right leg. Was + for bleed in brain and +DVT sent to [facility] for clot removal.

## 2022-04-29 NOTE — Consult Note (Addendum)
Neurology Consultation Reason for Consult: [place] Requesting Physician: Merlyn Lot  CC: Right upper extremity shaking  History is obtained from: Patient and husband at bedside as well as chart review  HPI: Whitney [name] is a 81 y.o. female with a past medical history significant for recent multifocal hemorrhagic lesions of unclear etiology (follows with Dr. 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  She had gotten up just after 6 AM to use the bathroom, with no right-sided weakness or shaking.  When she got back into bed she noticed her right arm started to shake.  She arrived to [facility] for this complaint and was treated with Ativan for focal seizure with resolution and head CT revealed recurrent acute hemorrhages most notably in the left motor cortex for which neurology was consulted  Of note she has had extensive work-up for her multifocal hemorrhages at [facility] with out a clear etiology identified, including biopsy which showed reactive and ischemic changes with perivascular mixed inflammation without definite evidence of amyloid angiopathy primary or metastatic neoplasm or vasculitis  LKW: 6:30 AM Thrombolytic given?: No, ICH Premorbid modified rankin scale:      2 - Slight disability. Able to look after own affairs without assistance, but unable to carry out all previous activities. (Not driving due to recent events)  ICH Score: 1  Time performed: 10:30 AM GCS: 13-15 is 0 points Infratentorial: No.. If yes, 1 point -- 0  Volume: <30cc is 0 points  Age: 81 y.o.. >80 is 1 point Intraventricular extension is 1 point -- 0  Score:1  A Score of 1 points has a 30 day mortality of 13%. Stroke. 2001 Apr;32(4):891-7.    ROS: All other  review of systems was negative except as noted, with the caveat of a mild aphasia.  Significantly she denies any recent headaches, does report some occasional flashes/symptoms in the left lower quadrant that she states are intermittent but happen most often when she is reading/sleeping.  Currently happening about 4 or 5 times a week and did not happen prior to her last presentation with multifocal hemorrhages.  She also notes she was intermittently having twitching of the right side of her abdomen previously but did not notice this today.  She denies any other transient neurological symptoms or systemic signs or symptoms  Past Medical History:  Diagnosis Date   Allergy    Cancer ([place]) 2007   Colon Cancer stage 1  surgery only.  no chemo or xrt.   Diabetes mellitus without complication ([place])    Fibromuscular dysplasia (HCC)    Hypertension    NASH (nonalcoholic steatohepatitis)    Stroke [facility])    Past Surgical History:  Procedure Laterality Date   BREAST EXCISIONAL BIOPSY Bilateral 60's, 70's, 80's   Benign   Current Outpatient Medications  Medication Instructions   amLODipine (NORVASC) 10 mg, Oral, Daily   aspirin EC 81 MG tablet Oral   Blood Glucose Monitoring Suppl (FIFTY50 GLUCOSE METER 2.0) w/Device KIT Frequency:PHARMDIR   Dosage:0.0     Instructions:  Note:microfill contour strips BID Dose: 1   carvedilol (COREG) 6.25 mg, Oral, 2 times daily   Cholecalciferol (VITAMIN D3) 2000 UNITS capsule Oral   escitalopram (LEXAPRO) 10 MG tablet escitalopram 10 mg tablet   ferrous sulfate 325 (65 FE) MG tablet Oral  fluticasone (FLONASE) 50 MCG/ACT nasal spray Nasal   lisinopril (ZESTRIL) 40 mg, Oral, Daily   meclizine (ANTIVERT) 25 mg, Oral, Every 8 hours PRN   methocarbamol (ROBAXIN) 500 MG tablet TAKE 1 TABLET BY MOUTH FOUR TIMES A DAY AS NEEDED   omeprazole (PRILOSEC) 20 mg, Oral, Daily   pravastatin (PRAVACHOL) 10 MG tablet pravastatin 10 mg tablet   spironolactone (ALDACTONE) 25 mg,  Oral, Daily   traZODone (DESYREL) 50 MG tablet No dose, route, or frequency recorded.     Family History  Problem Relation Age of Onset   Breast cancer Neg Hx     Social History:  reports that she quit smoking about 46 years ago. Her smoking use included cigarettes. She has never used smokeless tobacco. She reports current alcohol use. She reports that she does not use drugs.   Exam: Current vital signs: BP (!) 146/64   Pulse 71   Temp 98.3 F (36.8 C) (Oral)   Resp (!) 23   Ht _0  (1.651 m)   Wt 67.4 kg   SpO2 97%   BMI 24.73 kg/m  Vital signs in last 24 hours: Temp:  [98.3 F (36.8 C)] 98.3 F (36.8 C) (10/25 0816) Pulse Rate:  [66-97] 71 (10/25 1100) Resp:  [23] 23 (10/25 0816) BP: (135-180)/(62-90) 146/64 (10/25 1100) SpO2:  [95 %-97 %] 97 % (10/25 1100) Weight:  [67.4 kg] 67.4 kg (10/25 0818)   Physical Exam  Constitutional: Appears elderly but no acute distress Psych: Affect appropriate to situation, pleasant and cooperative Eyes: No scleral injection HENT: No oropharyngeal obstruction.  MSK: no joint deformities.  Cardiovascular: Normal rate and regular rhythm.  Perfusing extremities well Respiratory: Effort normal, non-labored breathing GI: Soft.  No distension. There is no tenderness.  Skin: Warm dry and intact visible skin.  Persistent mild slight petechiae in the bilateral lower extremities, stable from prior per patient  Neuro: Mental Status: Patient is awake, alert, oriented to person, place, month, year, and situation.  However disoriented to date of birth, initially reporting it is "next month in October" Patient is able to give a clear and coherent history, though she missed reports a couple of her blood pressure medications.  Possibly mild neglect to right-sided double simultaneous stimulation but there is also significant sensory deficit on the side with almost full lack of sensation.  Cranial Nerves: II: Visual Fields are notable for difficulty  counting fingers in the right upper quadrant. Pupils are equal, round, and reactive to light.   III,IV, VI: EOMI without ptosis or diploplia.  Saccadic pursuits V: Facial sensation is symmetric to light touch VII: Facial movement is symmetric.  VIII: hearing is intact to voice X: Uvula elevates symmetrically XI: Shoulder shrug is weak on the right. XII: tongue is midline without atrophy or fasciculations.  Motor: Tone is normal. Bulk is normal.  She is 1-2 out of 5 in the right upper extremity, full strength through the left upper and lower extremity, with slight upper motor neuron pattern of weakness in the right lower extremity (3 hip flexion, 4+ knee flexion, 4+ foot dorsiflexion, otherwise 5) Sensory: Sensation is intact except in the right upper extremity Cerebellar: FNF intact on the left and HKS are intact bilaterally Gait:  Deferred   NIHSS total 8 Score breakdown: One-point for right upper quadrant visual impairment, 3 points for right arm drift, 2 points for right leg drift, one-point for right upper extremity sensory loss, one-point for mild aphasia (difficulty naming frames for example) Performed  at 10:30 AM time of patient arrival to ED    I have reviewed labs in epic and the results pertinent to this consultation are:  Basic Metabolic Panel: Recent Labs  Lab 04/29/22 0810  NA 136  K 4.6  CL 105  CO2 20*  GLUCOSE 169*  BUN 10  CREATININE 0.60  CALCIUM 9.5    CBC: Recent Labs  Lab 04/29/22 0810  WBC 3.7*  NEUTROABS 2.0  HGB 11.6*  HCT 35.2*  MCV 86.7  PLT 101*  Baseline white blood cell count 5-10  Coagulation Studies: Recent Labs    04/29/22 0810  LABPROT 14.3  INR 1.1    PTT mildly elevated at 42  I have reviewed the images obtained:  Head CT personally reviewed, agree with radiology: 1. Again there is strong evidence of cerebral metastatic disease, but with unusual constellation of findings: Marked regression since August of numerous up  to 2.6 cm brain lesions, but 5-6 new similar lesions now individually up to 1.9 cm now. Interval craniotomy on the right. And a new left motor strip lesion now is likely associated with the right upper extremity symptoms. This was discussed by telephone with Dr. Merlyn Lot on 04/29/2022 at 0904 hours. 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 [On review with Dr. Quentin Cornwall, this is a miscommunication and he confirmed what I saw on personal review of the records which is that the etiology of these lesions remains uncertain]   Impression: 81 year old woman with a past medical history as above presenting with recurrent multifocal hemorrhages complicated by focal right upper extremity seizure  Recommendations:  # Focal seizure - Keppra 1000 mg load, then 500 mg BID.  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 - MRI brain w/ and w/o and MRV - Holding off on CTA/CTV due to iodine contrast allergy - Stability scan in 6 hours - Frequent neuro checks, q1hr -- full addendum below, now appropriate for every 2 and may be transition to every 4 tomorrow if she remains stable - Echocardiogram was recently completed in August and in the absence of new cardiac symptoms I will hold off on ordering at this time - No antiplatelets due to [place] - Consider DVT PPx heparin at 24 hrs if stable, SCDs for now - Risk factor modification - Telemetry monitoring; patient already has an order for outpatient event monitor pending from outpatient neurologist - Blood pressure goal SBP < 140  - PT consult, OT consult, Speech consult when patient stabilized   Standard seizure precautions should be included  in patient's discharge instructions when she is ready for discharge: Per [facility] statutes, patients with seizures are not allowed to drive until  they have been seizure-free for six months. Use caution when using heavy equipment or power tools. Avoid working on ladders or at heights. Take showers instead of baths. Ensure the water temperature is not too high on the home water heater. Do not go swimming alone. When caring for infants or small children, sit down when holding, feeding, or changing them to minimize risk of injury to the child in the event you have a seizure.  To reduce risk of seizures, maintain good sleep  hygiene avoid alcohol and illicit drug use, take all anti-seizure medications as prescribed.     Whitney Noe MD-PhD [facility] [phone] [facility] coverage for [facility] is from 8 AM to 4 AM in-house and 4 PM to 8 PM by telephone/video. 8 PM to 8 AM emergent questions or overnight urgent questions should be addressed to Teleneurology On-call or [facility] neurohospitalist; contact information can be found on AMION   Total critical care time: 45 minutes   Critical care time was exclusive of separately billable procedures and treating other patients.   Critical care was necessary to treat or prevent imminent or life-threatening deterioration.   Critical care was time spent personally by me on the following activities: development of treatment plan with patient and/or surrogate as well as nursing, discussions with consultants/primary team, evaluation of patient's response to treatment, examination of patient, obtaining history from patient or surrogate, ordering and performing treatments and interventions, ordering and review of laboratory studies, ordering and review of radiographic studies, and re-evaluation of patient's condition as needed, as documented above.     Addendum: MRI brain reviewed, no significant change in hemorrhage, multiple  lesions as well described in radiology report and reviewed with Dr. Leonie Man as well 1. Numerous hemorrhagic lesions throughout the brain, described above and most suspicious for hemorrhagic metastases. 2. No evidence of dural venous sinus thrombosis.  Patient reexamined, her right upper extremity weakness continues to improve.   Her blood pressure has been stable after dose of oral blood pressure medications  Given intracerebral hemorrhage secondary to hemorrhagic metastases not on anticoagulation do not tend to be rapidly expanding, she is appropriate for every 2 neurochecks overnight, every 4 if she remains stable tomorrow  Patient and husband were updated at bedside  Whitney Noe MD-PhD [facility] [phone]

## 2022-04-29 NOTE — Assessment & Plan Note (Signed)
CPAP ordered

## 2022-04-29 NOTE — ED Notes (Signed)
Pt has been in MRI, causing delay in medication.

## 2022-04-29 NOTE — Assessment & Plan Note (Signed)
Focal seizure with right arm tremors in the setting of multiple brain lesions.  Patient was given Ativan with resolution of symptoms and subsequently loaded on Keppra.  High risk for additional seizures.   - Monitor in the stepdown with every 4 hour neurochecks - Continue Keppra - Seizure precautions - Ativan as needed for seizure activity lasting more than 5 minutes Neurology following

## 2022-04-29 NOTE — ED Notes (Signed)
Pt to MRI at this time.

## 2022-04-29 NOTE — Assessment & Plan Note (Addendum)
-   A1c 5.7 - SSI

## 2022-04-29 NOTE — Assessment & Plan Note (Signed)
SBP goal less than 130 given hemorrhagic lesions of the brain.  At this time, patient's blood pressure is within goal.  Continue Coreg, losartan, Norvasc

## 2022-04-30 ENCOUNTER — Inpatient Hospital Stay: Payer: Medicare Other

## 2022-04-30 DIAGNOSIS — D61818 Other pancytopenia: Secondary | ICD-10-CM | POA: Diagnosis not present

## 2022-04-30 DIAGNOSIS — I10 Essential (primary) hypertension: Secondary | ICD-10-CM | POA: Diagnosis not present

## 2022-04-30 DIAGNOSIS — R569 Unspecified convulsions: Secondary | ICD-10-CM | POA: Diagnosis not present

## 2022-04-30 DIAGNOSIS — G939 Disorder of brain, unspecified: Secondary | ICD-10-CM | POA: Diagnosis not present

## 2022-04-30 LAB — URINALYSIS, ROUTINE W REFLEX MICROSCOPIC
Bilirubin Urine: NEGATIVE
Glucose, UA: NEGATIVE mg/dL
Hgb urine dipstick: NEGATIVE
Ketones, ur: NEGATIVE mg/dL
Nitrite: NEGATIVE
Protein, ur: NEGATIVE mg/dL
Specific Gravity, Urine: 1.013 (ref 1.005–1.030)
pH: 6 (ref 5.0–8.0)

## 2022-04-30 LAB — URINE DRUG SCREEN, QUALITATIVE (ARMC ONLY)
Amphetamines, Ur Screen: NOT DETECTED
Barbiturates, Ur Screen: NOT DETECTED
Benzodiazepine, Ur Scrn: NOT DETECTED
Cannabinoid 50 Ng, Ur ~~LOC~~: NOT DETECTED
Cocaine Metabolite,Ur ~~LOC~~: NOT DETECTED
MDMA (Ecstasy)Ur Screen: NOT DETECTED
Methadone Scn, Ur: NOT DETECTED
Opiate, Ur Screen: NOT DETECTED
Phencyclidine (PCP) Ur S: NOT DETECTED
Tricyclic, Ur Screen: NOT DETECTED

## 2022-04-30 LAB — LIPID PANEL
Cholesterol: 149 mg/dL (ref 0–200)
HDL: 51 mg/dL (ref >40)
LDL Cholesterol: 82 mg/dL (ref 0–99)
Total CHOL/HDL Ratio: 2.9 RATIO
Triglycerides: 78 mg/dL (ref <150)
VLDL: 16 mg/dL (ref 0–40)

## 2022-04-30 LAB — GLUCOSE, CAPILLARY
Glucose-Capillary: 226 mg/dL — ABNORMAL HIGH (ref 70–99)
Glucose-Capillary: 208 mg/dL — ABNORMAL HIGH (ref 70–99)
Glucose-Capillary: 198 mg/dL — ABNORMAL HIGH (ref 70–99)
Glucose-Capillary: 243 mg/dL — ABNORMAL HIGH (ref 70–99)

## 2022-04-30 LAB — VITAMIN B12: Vitamin B-12: 350 pg/mL (ref 180–914)

## 2022-04-30 MED ORDER — CHLORHEXIDINE GLUCONATE CLOTH 2 % EX PADS
6.0000 | MEDICATED_PAD | Freq: Every day | CUTANEOUS | Status: DC
  Administered 2022-05-01: 6 via TOPICAL

## 2022-04-30 MED ORDER — VITAMIN B-12 1000 MCG PO TABS
1000.0000 ug | ORAL_TABLET | Freq: Every day | ORAL | Status: DC
  Administered 2022-04-30 – 2022-05-01 (×2): 1000 ug via ORAL
  Filled 2022-04-30 (×2): qty 1

## 2022-04-30 MED ORDER — IOHEXOL 350 MG/ML SOLN
85.0000 mL | Freq: Once | INTRAVENOUS | Status: AC | PRN
  Administered 2022-04-30: 85 mL via INTRAVENOUS

## 2022-04-30 MED ORDER — BUSPIRONE HCL 10 MG PO TABS
5.0000 mg | ORAL_TABLET | Freq: Two times a day (BID) | ORAL | Status: DC
  Administered 2022-04-30 – 2022-05-01 (×2): 5 mg via ORAL
  Filled 2022-04-30 (×2): qty 1

## 2022-04-30 NOTE — Progress Notes (Signed)
OT Cancellation Note  Patient Details Name: BETTEY MURAOKA MRN: [id number] DOB: June 09, 1941   Cancelled Treatment:    Reason Eval/Treat Not Completed: Medical issues which prohibited therapy. Per chart review, pt remains on bedrest at this time with further work up pending. OT will follow up tomorrow in regards to pt's ability to actively participate in therapeutic intervention.  Darleen Crocker, MS, OTR/L , CBIS ascom [phone]  04/30/22, 8:14 AM

## 2022-04-30 NOTE — Hospital Course (Signed)
81 y.o. female with medical history significant of multifocal supra and infratentorial hemorrhagic lesions, colon cancer, type 2 diabetes, cirrhosis secondary to NASH, CVA, hypertension, hyperlipidemia admitted for seizure and was found to have multifocal hemorrhages highly concerning for possible metastatic disease  10/26: Repeat CT today shows no findings suggestive of primary malignancy in chest abdomen pelvis.  Repeat CT head showed stable appearance

## 2022-04-30 NOTE — Progress Notes (Signed)
PT Cancellation Note  Patient Details Name: Whitney [name] MRN: [id number] DOB: Jun 09, 1941   Cancelled Treatment:    Reason Eval/Treat Not Completed: Active bedrest order. Patient currently on bed rest x 24 hours per order and pending further work up. PT will follow up as appropriate when medically ready.   Minna Merritts, PT, MPT  Percell Locus 04/30/2022, 8:08 AM

## 2022-04-30 NOTE — Progress Notes (Signed)
Progress Note   Patient: Whitney [name] YQI:[id number] DOB: Nov 04, 1940 DOA: 04/29/2022     1 DOS: the patient was seen and examined on 04/30/2022   Brief hospital course:  81 y.o. female with medical history significant of multifocal supra and infratentorial hemorrhagic lesions, colon cancer, type 2 diabetes, cirrhosis secondary to NASH, CVA, hypertension, hyperlipidemia admitted for seizure and was found to have multifocal hemorrhages highly concerning for possible metastatic disease  10/26: Repeat CT today shows no findings suggestive of primary malignancy in chest abdomen pelvis.  Repeat CT head showed stable appearance   Assessment and Plan: * Seizure ([place]) Focal seizure with right arm tremors in the setting of multiple brain lesions.  Patient was given Ativan with resolution of symptoms and subsequently loaded on Keppra.  High risk for additional seizures.   - Monitor in the stepdown with every 4 hour neurochecks - Continue Keppra - Seizure precautions - Ativan as needed for seizure activity lasting more than 5 minutes Neurology following  Brain lesion Per chart review, patient was diagnosed with multifocal lesions within the supratentorial and infratentorial brain consistent with hemorrhagic lesions multiple hemorrhagic lesions involving the supratentorial and infratentorial brain after experiencing dizziness.  She has since established with vascular neurology at [facility].  Biopsy was obtained that was negative for vasculitis or malignancy.  CT chest/abdomen/pelvis and PET scan was negative for malignancy.  Per Dr. Beverely Risen note, differential at this time includes ischemic stroke with hemorrhagic transformation versus multiple cavernoma's.  CT and MRI obtained on this admission demonstrates additional lesions.  - Neurology following and appreciate their recommendations - Maintain SBP less than 130 - No pharmacological DVT prophylaxis -Repeat CT head shows stable findings from  before -CT chest/abdomen/pelvis repeated today shows no obvious lesions suggestive of primary malignancy there -I have placed her on a transfer list at [facility] per family request although she may not likely need this as she is clinically doing well.  She also has outpatient neurosurgery appointment scheduled with Dr. Cathleen Fears next Tuesday (appreciate Dr. Rhea Bleacher assistance in coordinating this).  If she continues to do well clinically she may be potentially go home tomorrow with outpatient follow-up with Dr. Lacinda Axon next week on Tuesday  Pancytopenia [facility]) Patient has a history of anemia, however leukopenia and thrombocytopenia appear new.  Thrombocytopenia can be explained by patient's history of cirrhosis, however uncertain why patient is developed leukopenia.  Patient has already established with hematology at [facility].  - Continue outpatient follow-up with [facility] hematology - Monitor CBC She does have low normal B12 of 270.  Continue vitamin B12  HTN (hypertension) SBP goal less than 130 given hemorrhagic lesions of the brain.  At this time, patient's blood pressure is within goal.  Continue Coreg, losartan, Norvasc  Diabetes mellitus type 2, controlled, without complications (HCC) - L8V 5.7 - SSI  OSA on CPAP - CPAP as needed        Subjective: No further seizure activity.  Improving right upper extremity weakness.  Patient overall feeling much better  Physical Exam: Vitals:   04/30/22 1434 04/30/22 1500 04/30/22 1600 04/30/22 1700  BP:  (!) 138/109 (!) 136/59 116/82  Pulse: 82 79 79 80  Resp: '20 18 16 19  '$ Temp: 97.8 F (36.6 C)     TempSrc: Oral     SpO2: 98% 100% 98% 97%  Weight:      Height:       81 year old female lying in the bed comfortably without any acute distress Lungs clear to  auscultation bilaterally Heart regular rate and rhythm Abdomen soft, benign Neuro alert and awake, right upper and lower extremity 4 out of 5 strength.  Left side 5 out of 5 Skin no  rash or lesion Data Reviewed:  CT chest abdomen pelvis not showing any obvious primary malignant lesion  Family Communication: Husband updated over phone  Disposition: Status is: Inpatient Remains inpatient appropriate because: Close neurological monitoring   Planned Discharge Destination: Home    DVT prophylaxis-SCDs  time spent: 35 minutes  Author: Max Sane, MD 04/30/2022 5:33 PM  For on call review [email or web address].

## 2022-04-30 NOTE — Progress Notes (Signed)
Neurology Progress Note  Subjective: Improving RUE weakness, no acute complaints feeling better, tolerating keppra well, no further seizure activity  Exam: Vitals:   04/30/22 0700 04/30/22 0800  BP: (!) 112/100   Pulse: 75   Resp: 18   Temp:  98.3 F (36.8 C)  SpO2: 97%    Gen: In bed, comfortable  Resp: non-labored breathing, no grossly audible wheezing Cardiac: Perfusing extremities well  Abd: soft, nt  Neuro: MS: Aware, alert, fully oriented, no neglect, naming more briskly today CN: VFF to counting fingers,  Motor: RUE 4/5, upper motor neuron pattern, RLE 4+/5 in flexors, 5/5 in extensors, full strength on the left Sensory: Improved sensation on the right side   Pertinent Labs:  Basic Metabolic Panel: Recent Labs  Lab 04/29/22 0810  NA 136  K 4.6  CL 105  CO2 20*  GLUCOSE 169*  BUN 10  CREATININE 0.60  CALCIUM 9.5    CBC: Recent Labs  Lab 04/29/22 0810  WBC 3.7*  NEUTROABS 2.0  HGB 11.6*  HCT 35.2*  MCV 86.7  PLT 101*    Coagulation Studies: Recent Labs    04/29/22 0810  LABPROT 14.3  INR 1.1    Lab Results  Component Value Date   HGBA1C 5.7 (H) 04/29/2022    Impression: Unexplained multifocal hemorrhages, highly concerning for metastatic disease though ABRA/CAA-ri would also be on the differential. Less likely cavernomas given rapid development of new lesions, very atypical for ischemic strokes with secondary hemorrhagic conversion (will continue telemetry, to eval for occult afib, event monitor already planned outpatient)  Recommendations: - Pending CT chest/abdomen/pelvis today - Will consider repeat brain biopsy if negative - Will continue to reach out to [facility] for transfer per Dr. Claris Gladden and patient request, appreciate primary team assistance with this - Continue plan for event monitor, order already placed by Dr. Claris Gladden outpatient  - Case discussed with Dr. Claris Gladden, Dr. Manuella Ghazi; will discuss with Dr. Izora Ribas pending CT  chest/abdomen/pelvis results  # Low normal B12 > 12/25/2021 270 - repeat B12 and MMA - B12 1000 mcg daily   Lesleigh Noe MD-PhD [facility] [phone]   Greater than 50 min spent in care of this patient, the majority at bedside / direct care coordination

## 2022-04-30 NOTE — Progress Notes (Signed)
Traveled to CT with patient. Pt is back in room at this time. VSS and no distress noted.

## 2022-04-30 NOTE — TOC Progression Note (Signed)
Transition of Care [facility]) - Progression Note    Patient Details  Name: Whitney [name] MRN: [id number] Date of Birth: Apr 22, 1941  Transition of Care [facility]) CM/SW Contact  Shelbie Hutching, RN Phone Number: 04/30/2022, 2:30 PM  Clinical Narrative:    Patient will need speech therapy home health at discharge also, [facility] can also accept patient for speech therapy.     Expected Discharge Plan: [place] Barriers to Discharge: Continued Medical Work up  Expected Discharge Plan and Services Expected Discharge Plan: [place]   Discharge Planning Services: CM Consult Post Acute Care Choice: [place] arrangements for the past 2 months: Single Family Home                 DME Arranged: N/A DME Agency: NA       HH Arranged: PT, OT, Speech Therapy [place]: [place] ([facility]) Date HH Agency Contacted: 04/30/22 Time [place]: 1330 Representative spoke with at [place]: [place] ([place]) Interventions    Readmission Risk Interventions     No data to display

## 2022-04-30 NOTE — TOC Initial Note (Signed)
Transition of Care [facility]) - Initial/Assessment Note    Patient Details  Name: Whitney [name] MRN: [id number] Date of Birth: 06/07/1941  Transition of Care [facility]) CM/SW Contact:    Shelbie Hutching, RN Phone Number: 04/30/2022, 1:31 PM  Clinical Narrative:                 Patient admitted to the hospital after having a seizure at home, found to have multifocal hemorrhages, unknown etiology, patient is being followed outpatient by [facility] neurologist.  [facility] met with patient at the bedside this morning, husband is also at the bedside, introduced self and explained role.   Patient is from home with her husband, she has been getting around at home with a cane, she also has a walker.  Patient's husband provides transportation.  Patient agrees to home health services when she is able to discharge from the hospital.  Corene Cornea with [facility] accepted referral for PT and OT.    TOC will cont to follow.   Expected Discharge Plan: [place] Barriers to Discharge: Continued Medical Work up   Patient Goals and CMS Choice        Expected Discharge Plan and Services Expected Discharge Plan: [place]   Discharge Planning Services: CM Consult Post Acute Care Choice: [place] arrangements for the past 2 months: Single Family Home                 DME Arranged: N/A DME Agency: NA       HH Arranged: PT, OT [place] Agency: [place] ([facility]) Date HH Agency Contacted: 04/30/22 Time HH Agency Contacted: 1330 Representative spoke with at [place]: Corene Cornea  Prior Living Arrangements/Services Living arrangements for the past 2 months: [place] Lives with:: Spouse Patient language and need for interpreter reviewed:: Yes Do you feel safe going back to the place where you live?: Yes      Need for Family Participation in Patient Care: Yes (Comment) Care giver support system in place?: Yes (comment) Current home services: DME  (walker and cane) Criminal Activity/Legal Involvement Pertinent to Current Situation/Hospitalization: No - Comment as needed  Activities of Daily Living Home Assistive Devices/Equipment: Eyeglasses, Hearing aid, Cane (specify quad or straight) ADL Screening (condition at time of admission) Patient's cognitive ability adequate to safely complete daily activities?: Yes Is the patient deaf or have difficulty hearing?: Yes (but has hearing aids) Does the patient have difficulty seeing, even when wearing glasses/contacts?: No Does the patient have difficulty concentrating, remembering, or making decisions?: No Patient able to express need for assistance with ADLs?: Yes Does the patient have difficulty dressing or bathing?: No Independently performs ADLs?: Yes (appropriate for developmental age) Does the patient have difficulty walking or climbing stairs?: Yes Weakness of Legs: Both Weakness of Arms/Hands: None  Permission Sought/Granted Permission sought to share information with : Case Manager, Family Supports, Other (comment) Permission granted to share information with : Yes, Verbal Permission Granted  Share Information with NAME: Jaydyn Menon  Permission granted to share info w AGENCY: Home health agency  Permission granted to share info w Relationship: spouse  Permission granted to share info w Contact Information: [phone]  Emotional Assessment Appearance:: Appears stated age Attitude/Demeanor/Rapport: Engaged Affect (typically observed): Accepting Orientation: : Oriented to Self, Oriented to Place, Oriented to  Time, Oriented to Situation Alcohol / Substance Use: Not Applicable Psych Involvement: No (comment)  Admission diagnosis:  Focal seizure ([place]) [R56.9] Seizure ([place]) [  R56.9] Hemorrhagic cerebrovascular accident (CVA) [facility]) [I61.9] Patient Active Problem List   Diagnosis Date Noted   Seizure ([place]) 04/29/2022   Brain lesion 04/29/2022   Pancytopenia ([place])  04/29/2022   Fibromuscular dysplasia ([place]) 04/19/2022   History of CVA (cerebrovascular accident) 02/17/2022   Coronary artery disease of native artery of native heart with stable angina pectoris ([place]) 07/15/2021   Esophageal varices without bleeding ([place]) 10/31/2020   Dysthymia 04/12/2019   OSA on CPAP 04/12/2019   Osteopenia 04/12/2019   Spondylosis without myelopathy or radiculopathy, lumbar region 04/12/2019   Myofascial pain syndrome 04/12/2019   Chronic right-sided low back pain without sciatica 12/16/2018   Osteoarthritis of carpometacarpal ([place]) joint of thumb 07/25/2018   Liver cirrhosis secondary to NASH ([place]) 10/08/2015   Diabetes mellitus type 2, controlled, without complications ([place]) 66/46/6056   Vitamin D deficiency 03/29/2013   Hyperlipidemia, unspecified 05/06/2011   NAFLD (nonalcoholic fatty liver disease) 05/06/2011   Obesity (BMI 30-39.9) 05/06/2011   VULVAR VESTIBULITIS 07/30/2009   HTN (hypertension) 07/07/2007   H/O colon cancer, stage I 07/06/2005   PCP:  Glean Salvo, MD Pharmacy:   [facility] [id number], [place] [address] Phone: [phone] Fax: [phone]     Social Determinants of Health (SDOH) Interventions    Readmission Risk Interventions     No data to display

## 2022-04-30 NOTE — Progress Notes (Signed)
1900 patient alert able to make all needs known call light in reach patient on room air right sided weakness noted more in upper ext than lower no other deficit noted 2000 patient assisted to bedside commode 1 person assist to have bowel movement  04/30/22 0000 NIHSS remains the same

## 2022-04-30 NOTE — Plan of Care (Signed)
Problem: Education: Goal: Knowledge of disease or condition will improve 04/30/2022 0049 by Neomia Glass, RN Outcome: Progressing 04/30/2022 0049 by Neomia Glass, RN Outcome: Progressing Goal: Knowledge of secondary prevention will improve (MUST DOCUMENT ALL) 04/30/2022 0049 by Neomia Glass, RN Outcome: Progressing 04/30/2022 0049 by Neomia Glass, RN Outcome: Progressing Goal: Knowledge of patient specific risk factors will improve Elta [place] N/A or DELETE if not current risk factor) 04/30/2022 0049 by Neomia Glass, RN Outcome: Progressing 04/30/2022 0049 by Neomia Glass, RN Outcome: Progressing   Problem: Intracerebral Hemorrhage Tissue Perfusion: Goal: Complications of Intracerebral Hemorrhage will be minimized 04/30/2022 0049 by Neomia Glass, RN Outcome: Progressing 04/30/2022 0049 by Neomia Glass, RN Outcome: Progressing   Problem: Coping: Goal: Will verbalize positive feelings about self 04/30/2022 0049 by Neomia Glass, RN Outcome: Progressing 04/30/2022 0049 by Neomia Glass, RN Outcome: Progressing Goal: Will identify appropriate support needs 04/30/2022 0049 by Neomia Glass, RN Outcome: Progressing 04/30/2022 0049 by Neomia Glass, RN Outcome: Progressing   Problem: Health Behavior/Discharge Planning: Goal: Ability to manage health-related needs will improve 04/30/2022 0049 by Neomia Glass, RN Outcome: Progressing 04/30/2022 0049 by Neomia Glass, RN Outcome: Progressing Goal: Goals will be collaboratively established with patient/family 04/30/2022 0049 by Neomia Glass, RN Outcome: Progressing 04/30/2022 0049 by Neomia Glass, RN Outcome: Progressing   Problem: Self-Care: Goal: Ability to participate in self-care as condition permits will improve 04/30/2022 0049 by Neomia Glass, RN Outcome: Progressing 04/30/2022 0049 by Neomia Glass, RN Outcome: Progressing Goal: Verbalization of feelings and concerns over  difficulty with self-care will improve 04/30/2022 0049 by Neomia Glass, RN Outcome: Progressing 04/30/2022 0049 by Neomia Glass, RN Outcome: Progressing Goal: Ability to communicate needs accurately will improve 04/30/2022 0049 by Neomia Glass, RN Outcome: Progressing 04/30/2022 0049 by Neomia Glass, RN Outcome: Progressing   Problem: Nutrition: Goal: Risk of aspiration will decrease 04/30/2022 0049 by Neomia Glass, RN Outcome: Progressing 04/30/2022 0049 by Neomia Glass, RN Outcome: Progressing Goal: Dietary intake will improve 04/30/2022 0049 by Neomia Glass, RN Outcome: Progressing 04/30/2022 0049 by Neomia Glass, RN Outcome: Progressing   Problem: Education: Goal: Ability to describe self-care measures that may prevent or decrease complications (Diabetes Survival Skills Education) will improve 04/30/2022 0049 by Neomia Glass, RN Outcome: Progressing 04/30/2022 0049 by Neomia Glass, RN Outcome: Progressing Goal: Individualized Educational Video(s) 04/30/2022 0049 by Neomia Glass, RN Outcome: Progressing 04/30/2022 0049 by Neomia Glass, RN Outcome: Progressing   Problem: Coping: Goal: Ability to adjust to condition or change in health will improve 04/30/2022 0049 by Neomia Glass, RN Outcome: Progressing 04/30/2022 0049 by Neomia Glass, RN Outcome: Progressing   Problem: Fluid Volume: Goal: Ability to maintain a balanced intake and output will improve 04/30/2022 0049 by Neomia Glass, RN Outcome: Progressing 04/30/2022 0049 by Neomia Glass, RN Outcome: Progressing   Problem: Health Behavior/Discharge Planning: Goal: Ability to identify and utilize available resources and services will improve 04/30/2022 0049 by Neomia Glass, RN Outcome: Progressing 04/30/2022 0049 by Neomia Glass, RN Outcome: Progressing Goal: Ability to manage health-related needs will improve 04/30/2022 0049 by Neomia Glass,  RN Outcome: Progressing 04/30/2022 0049 by Neomia Glass, RN Outcome: Progressing   Problem: Metabolic: Goal: Ability to maintain appropriate glucose levels will improve 04/30/2022 0049 by Neomia Glass, RN Outcome: Progressing 04/30/2022 0049 by Neomia Glass, RN Outcome:  Progressing   Problem: Nutritional: Goal: Maintenance of adequate nutrition will improve 04/30/2022 0049 by Neomia Glass, RN Outcome: Progressing 04/30/2022 0049 by Neomia Glass, RN Outcome: Progressing Goal: Progress toward achieving an optimal weight will improve 04/30/2022 0049 by Neomia Glass, RN Outcome: Progressing 04/30/2022 0049 by Neomia Glass, RN Outcome: Progressing   Problem: Skin Integrity: Goal: Risk for impaired skin integrity will decrease 04/30/2022 0049 by Neomia Glass, RN Outcome: Progressing 04/30/2022 0049 by Neomia Glass, RN Outcome: Progressing   Problem: Tissue Perfusion: Goal: Adequacy of tissue perfusion will improve 04/30/2022 0049 by Neomia Glass, RN Outcome: Progressing 04/30/2022 0049 by Neomia Glass, RN Outcome: Progressing   Problem: Education: Goal: Knowledge of General Education information will improve Description: Including pain rating scale, medication(s)/side effects and non-pharmacologic comfort measures 04/30/2022 0049 by Neomia Glass, RN Outcome: Progressing 04/30/2022 0049 by Neomia Glass, RN Outcome: Progressing   Problem: Health Behavior/Discharge Planning: Goal: Ability to manage health-related needs will improve 04/30/2022 0049 by Neomia Glass, RN Outcome: Progressing 04/30/2022 0049 by Neomia Glass, RN Outcome: Progressing   Problem: Clinical Measurements: Goal: Ability to maintain clinical measurements within normal limits will improve 04/30/2022 0049 by Neomia Glass, RN Outcome: Progressing 04/30/2022 0049 by Neomia Glass, RN Outcome: Progressing Goal: Will remain free from  infection 04/30/2022 0049 by Neomia Glass, RN Outcome: Progressing 04/30/2022 0049 by Neomia Glass, RN Outcome: Progressing Goal: Diagnostic test results will improve 04/30/2022 0049 by Neomia Glass, RN Outcome: Progressing 04/30/2022 0049 by Neomia Glass, RN Outcome: Progressing Goal: Respiratory complications will improve 04/30/2022 0049 by Neomia Glass, RN Outcome: Progressing 04/30/2022 0049 by Neomia Glass, RN Outcome: Progressing Goal: Cardiovascular complication will be avoided 04/30/2022 0049 by Neomia Glass, RN Outcome: Progressing 04/30/2022 0049 by Neomia Glass, RN Outcome: Progressing   Problem: Activity: Goal: Risk for activity intolerance will decrease 04/30/2022 0049 by Neomia Glass, RN Outcome: Progressing 04/30/2022 0049 by Neomia Glass, RN Outcome: Progressing   Problem: Nutrition: Goal: Adequate nutrition will be maintained 04/30/2022 0049 by Neomia Glass, RN Outcome: Progressing 04/30/2022 0049 by Neomia Glass, RN Outcome: Progressing   Problem: Coping: Goal: Level of anxiety will decrease 04/30/2022 0049 by Neomia Glass, RN Outcome: Progressing 04/30/2022 0049 by Neomia Glass, RN Outcome: Progressing   Problem: Elimination: Goal: Will not experience complications related to bowel motility 04/30/2022 0049 by Neomia Glass, RN Outcome: Progressing 04/30/2022 0049 by Neomia Glass, RN Outcome: Progressing Goal: Will not experience complications related to urinary retention 04/30/2022 0049 by Neomia Glass, RN Outcome: Progressing 04/30/2022 0049 by Neomia Glass, RN Outcome: Progressing   Problem: Pain Managment: Goal: General experience of comfort will improve 04/30/2022 0049 by Neomia Glass, RN Outcome: Progressing 04/30/2022 0049 by Neomia Glass, RN Outcome: Progressing   Problem: Safety: Goal: Ability to remain free from injury will improve 04/30/2022 0049 by Neomia Glass,  RN Outcome: Progressing 04/30/2022 0049 by Neomia Glass, RN Outcome: Progressing   Problem: Skin Integrity: Goal: Risk for impaired skin integrity will decrease 04/30/2022 0049 by Neomia Glass, RN Outcome: Progressing 04/30/2022 0049 by Neomia Glass, RN Outcome: Progressing   Problem: Education: Goal: Knowledge of disease or condition will improve 04/30/2022 0049 by Neomia Glass, RN Outcome: Progressing 04/30/2022 0049 by Neomia Glass, RN Outcome: Progressing Goal: Knowledge of secondary prevention will improve (MUST DOCUMENT ALL) 04/30/2022 0049 by  Neomia Glass, RN Outcome: Progressing 04/30/2022 0049 by Neomia Glass, RN Outcome: Progressing Goal: Knowledge of patient specific risk factors will improve Elta [place] N/A or DELETE if not current risk factor) 04/30/2022 0049 by Neomia Glass, RN Outcome: Progressing 04/30/2022 0049 by Neomia Glass, RN Outcome: Progressing   Problem: Ischemic Stroke/TIA Tissue Perfusion: Goal: Complications of ischemic stroke/TIA will be minimized 04/30/2022 0049 by Neomia Glass, RN Outcome: Progressing 04/30/2022 0049 by Neomia Glass, RN Outcome: Progressing   Problem: Coping: Goal: Will verbalize positive feelings about self 04/30/2022 0049 by Neomia Glass, RN Outcome: Progressing 04/30/2022 0049 by Neomia Glass, RN Outcome: Progressing Goal: Will identify appropriate support needs 04/30/2022 0049 by Neomia Glass, RN Outcome: Progressing 04/30/2022 0049 by Neomia Glass, RN Outcome: Progressing   Problem: Health Behavior/Discharge Planning: Goal: Ability to manage health-related needs will improve 04/30/2022 0049 by Neomia Glass, RN Outcome: Progressing 04/30/2022 0049 by Neomia Glass, RN Outcome: Progressing Goal: Goals will be collaboratively established with patient/family 04/30/2022 0049 by Neomia Glass, RN Outcome: Progressing 04/30/2022 0049 by Neomia Glass, RN Outcome:  Progressing  Patient NIHSS 2 pt able to make all needs known right side weakness noted

## 2022-04-30 NOTE — Evaluation (Signed)
Speech Language Pathology Evaluation Patient Details Name: Whitney [name] MRN: [id number] DOB: [birth year] Today's Date: 04/30/2022 Time: 1030-1055 SLP Time Calculation (min) (ACUTE ONLY): 25 min  Problem List:  Patient Active Problem List   Diagnosis Date Noted   Seizure (Whitney [name]) 04/29/2022   Brain lesion 04/29/2022   Pancytopenia (Whitney [name]) 04/29/2022   Fibromuscular dysplasia (Whitney [name]) 04/19/2022   History of CVA (cerebrovascular accident) 02/17/2022   Coronary artery disease of native artery of native heart with stable angina pectoris (Whitney [name]) 07/15/2021   Esophageal varices without bleeding (Whitney [name]) 10/31/2020   Dysthymia 04/12/2019   OSA on CPAP 04/12/2019   Osteopenia 04/12/2019   Spondylosis without myelopathy or radiculopathy, lumbar region 04/12/2019   Myofascial pain syndrome 04/12/2019   Chronic right-sided low back pain without sciatica 12/16/2018   Osteoarthritis of carpometacarpal ([place]) joint of thumb 07/25/2018   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  no chemo or xrt.   Diabetes mellitus without complication ([place])    Fibromuscular dysplasia ([facility])    Hypertension    NASH (nonalcoholic steatohepatitis)    Stroke Whitney [name])    Past Surgical History:  Past Surgical History:  Procedure Laterality Date   BREAST EXCISIONAL BIOPSY Bilateral 60's, 70's, 80's   Benign   HPI:  Per H&P: "Whitney [name] is a 80 y.o. female with medical history significant of multifocal supra and infratentorial  hemorrhagic lesions, colon cancer, type 2 diabetes, cirrhosis secondary to NASH, CVA, hypertension, hyperlipidemia, who presents to the ED with right arm shaking" MRI 04/29/22: "Numerous hemorrhagic lesions throughout the brain, described above and most suspicious for hemorrhagic metastases. No evidence of dural venous sinus thrombosis."   Assessment / Plan / Recommendation Clinical Impression  Pt seen for cognitive communication assessment in the setting of multifocal hemorrhages. Assessment consisted of portions of the Mini Mental State Examination and pt/spouse interview. Minimal cognitive communication deficits noted at this time; however, pt endorses intermittent instances of anomia since onset of tremor yesterday, which is different from baseline. Anomic hesitation witnessed x1 in conversation and semantic paraphasia noted x1. Pt independently utilizing synonym and description for compensation at conversational level. Writing deferred secondary to right upper extremity weakness. Basic verbal expression, receptive language, and reading comprehension is relatively preserved. Intact attention, safety reasoning, delayed recall, and problem solving also identified. Recommend follow up home health for further assessment. No further acute services indicated at this time.    SLP Assessment  SLP Recommendation/Assessment: All further Speech Lanaguage Pathology  needs can be addressed in the next venue of care SLP Visit Diagnosis: Cognitive communication deficit (R41.841)    Recommendations for follow up therapy are one component of a multi-disciplinary discharge planning process, led by the attending physician.  Recommendations may be updated based on patient status, additional functional criteria and insurance authorization.    Follow Up Recommendations  Home health SLP    Assistance Recommended at Discharge   (defer to PT/OT)  Functional Status Assessment Patient has had a recent decline in their  functional status and  demonstrates the ability to make significant improvements in function in a reasonable and predictable amount of time.  Frequency and Duration           SLP Evaluation Cognition  Overall Cognitive Status: Impaired/Different from baseline Arousal/Alertness: Awake/alert Orientation Level: Oriented X4 (Simultaneous filing. User may not have seen previous data.) Attention: Sustained Sustained Attention: Appears intact Memory: Appears intact (2/3 lexical recall increasing to 3/3 with min verbal cues) Awareness: Appears intact Problem Solving: Appears intact (serial subtraction 3/5, pt reported likely challenge at baseline) Executive Function: Reasoning Reasoning: Appears intact Safety/Judgment: Appears intact       Comprehension  Auditory Comprehension Overall Auditory Comprehension: Appears within functional limits for tasks assessed Visual Recognition/Discrimination Discrimination: Within Function Limits Reading Comprehension Reading Status: Within funtional limits (at sentence level)    Expression Expression Primary Mode of Expression: Verbal Verbal Expression Overall Verbal Expression: Impaired Initiation: No impairment Repetition: No impairment Naming: No impairment Pragmatics: No impairment Effective Techniques: Semantic cues Other Verbal Expression Comments: conversational level deficits Written Expression Dominant Hand: Right Written Expression:  (not addressed)   Oral / Motor  Oral Motor/Sensory Function Overall Oral Motor/Sensory Function: Within functional limits Motor Speech Overall Motor Speech: Appears within functional limits for tasks assessed           Martinique Antuane Eastridge Clapp MS [name] SLP   Martinique J Clapp 04/30/2022, 2:41 PM

## 2022-04-30 NOTE — Plan of Care (Signed)
Problem: Education: Goal: Knowledge of disease or condition will improve Outcome: Progressing Goal: Knowledge of secondary prevention will improve (MUST DOCUMENT ALL) Outcome: Progressing Goal: Knowledge of patient specific risk factors will improve Elta [place] N/A or DELETE if not current risk factor) Outcome: Progressing   Problem: Intracerebral Hemorrhage Tissue Perfusion: Goal: Complications of Intracerebral Hemorrhage will be minimized Outcome: Not Progressing   Problem: Coping: Goal: Will verbalize positive feelings about self Outcome: Progressing Goal: Will identify appropriate support needs Outcome: Progressing   Problem: Health Behavior/Discharge Planning: Goal: Ability to manage health-related needs will improve Outcome: Progressing Goal: Goals will be collaboratively established with patient/family Outcome: Progressing   Problem: Self-Care: Goal: Ability to participate in self-care as condition permits will improve Outcome: Not Progressing Goal: Verbalization of feelings and concerns over difficulty with self-care will improve Outcome: Not Progressing Goal: Ability to communicate needs accurately will improve Outcome: Not Progressing   Problem: Nutrition: Goal: Risk of aspiration will decrease Outcome: Progressing Goal: Dietary intake will improve Outcome: Progressing   Problem: Coping: Goal: Ability to adjust to condition or change in health will improve Outcome: Progressing   Problem: Fluid Volume: Goal: Ability to maintain a balanced intake and output will improve Outcome: Progressing   Problem: Health Behavior/Discharge Planning: Goal: Ability to identify and utilize available resources and services will improve Outcome: Progressing Goal: Ability to manage health-related needs will improve Outcome: Progressing   Problem: Metabolic: Goal: Ability to maintain appropriate glucose levels will improve Outcome: Progressing   Problem: Nutritional: Goal:  Maintenance of adequate nutrition will improve Outcome: Progressing Goal: Progress toward achieving an optimal weight will improve Outcome: Progressing   Problem: Skin Integrity: Goal: Risk for impaired skin integrity will decrease Outcome: Progressing   Problem: Tissue Perfusion: Goal: Adequacy of tissue perfusion will improve Outcome: Progressing   Problem: Education: Goal: Knowledge of General Education information will improve Description: Including pain rating scale, medication(s)/side effects and non-pharmacologic comfort measures Outcome: Progressing   Problem: Health Behavior/Discharge Planning: Goal: Ability to manage health-related needs will improve Outcome: Progressing   Problem: Clinical Measurements: Goal: Ability to maintain clinical measurements within normal limits will improve Outcome: Progressing Goal: Will remain free from infection Outcome: Progressing Goal: Diagnostic test results will improve Outcome: Progressing Goal: Respiratory complications will improve Outcome: Progressing Goal: Cardiovascular complication will be avoided Outcome: Progressing   Problem: Activity: Goal: Risk for activity intolerance will decrease Outcome: Progressing   Problem: Nutrition: Goal: Adequate nutrition will be maintained Outcome: Progressing   Problem: Coping: Goal: Level of anxiety will decrease Outcome: Progressing   Problem: Elimination: Goal: Will not experience complications related to bowel motility Outcome: Progressing Goal: Will not experience complications related to urinary retention Outcome: Progressing   Problem: Pain Managment: Goal: General experience of comfort will improve Outcome: Progressing   Problem: Safety: Goal: Ability to remain free from injury will improve Outcome: Progressing   Problem: Skin Integrity: Goal: Risk for impaired skin integrity will decrease Outcome: Progressing   Problem: Education: Goal: Knowledge of disease  or condition will improve Outcome: Progressing Goal: Knowledge of secondary prevention will improve (MUST DOCUMENT ALL) Outcome: Progressing Goal: Knowledge of patient specific risk factors will improve [place] N/A or DELETE if not current risk factor) Outcome: Progressing   Problem: Ischemic Stroke/TIA Tissue Perfusion: Goal: Complications of ischemic stroke/TIA will be minimized Outcome: Progressing   Problem: Coping: Goal: Will verbalize positive feelings about self Outcome: Progressing Goal: Will identify appropriate support needs Outcome: Progressing   Problem: Health Behavior/Discharge Planning: Goal: Ability to manage health-related  needs will improve Outcome: Progressing Goal: Goals will be collaboratively established with patient/family Outcome: Progressing Patient unable to do ADL care due to right arm weakness and dexterity in hands patient unable to manipulate anything she holds no complaints of pain

## 2022-05-01 DIAGNOSIS — I619 Nontraumatic intracerebral hemorrhage, unspecified: Principal | ICD-10-CM

## 2022-05-01 DIAGNOSIS — R569 Unspecified convulsions: Secondary | ICD-10-CM | POA: Diagnosis not present

## 2022-05-01 DIAGNOSIS — G939 Disorder of brain, unspecified: Secondary | ICD-10-CM | POA: Diagnosis not present

## 2022-05-01 LAB — BASIC METABOLIC PANEL
Anion gap: 10 (ref 5–15)
BUN: 14 mg/dL (ref 8–23)
CO2: 23 mmol/L (ref 22–32)
Calcium: 9.3 mg/dL (ref 8.9–10.3)
Chloride: 103 mmol/L (ref 98–111)
Creatinine, Ser: 0.53 mg/dL (ref 0.44–1.00)
GFR, Estimated: >60 mL/min (ref >60)
Glucose, Bld: 148 mg/dL — ABNORMAL HIGH (ref 70–99)
Potassium: 3.8 mmol/L (ref 3.5–5.1)
Sodium: 136 mmol/L (ref 135–145)

## 2022-05-01 LAB — GLUCOSE, CAPILLARY
Glucose-Capillary: 164 mg/dL — ABNORMAL HIGH (ref 70–99)
Glucose-Capillary: 133 mg/dL — ABNORMAL HIGH (ref 70–99)

## 2022-05-01 LAB — CBC
HCT: 32.1 % — ABNORMAL LOW (ref 36.0–46.0)
Hemoglobin: 10.6 g/dL — ABNORMAL LOW (ref 12.0–15.0)
MCH: 28.3 pg (ref 26.0–34.0)
MCHC: 33 g/dL (ref 30.0–36.0)
MCV: 85.6 fL (ref 80.0–100.0)
Platelets: 101 10*3/uL — ABNORMAL LOW (ref 150–400)
RBC: 3.75 MIL/uL — ABNORMAL LOW (ref 3.87–5.11)
RDW: 12.8 % (ref 11.5–15.5)
WBC: 6.7 10*3/uL (ref 4.0–10.5)
nRBC: 0 % (ref 0.0–0.2)

## 2022-05-01 MED ORDER — CYANOCOBALAMIN 1000 MCG PO TABS: 1000.0000 ug | ORAL_TABLET | Freq: Every day | ORAL | 0 refills | Status: AC

## 2022-05-01 MED ORDER — LEVETIRACETAM 500 MG PO TABS: 500.0000 mg | ORAL_TABLET | Freq: Two times a day (BID) | ORAL | 0 refills | Status: DC

## 2022-05-01 NOTE — Progress Notes (Addendum)
Neurology Progress Note  Subjective: Improving RUE weakness, no acute complaints feeling better, tolerating keppra well, no further seizure activity  Per [facility] notes, though I cannot see report of thyroid ultrasound or FNA report MRA also showed 2.5cm right thyroid nodule with recommendation to consider dedicated thyroid ultrasound. She underwent FNA of right thyroid 10/22/2020 which was normal. She underwent thyroid ultrasound in 12/2020 which was stable. Patient and husband confirmed FNA completed and benign  Exam: Current vital signs: BP 138/62   Pulse 64   Temp 98 F (36.7 C) (Oral)   Resp 14   Ht '5\' 5"'$  (1.651 m)   Wt 67.4 kg   SpO2 95%   BMI 24.73 kg/m  Vital signs in last 24 hours: Temp:  [97.6 F (36.4 C)-98 F (36.7 C)] 98 F (36.7 C) (10/27 0400) Pulse Rate:  [55-85] 64 (10/27 0500) Resp:  [12-20] 14 (10/27 0700) BP: (113-153)/(52-109) 138/62 (10/27 0700) SpO2:  [95 %-100 %] 95 % (10/27 0500)  General: Sitting up in bed, smiling and comfortable Resp: non-labored breathing, no grossly audible wheezing Cardiac: Perfusing extremities well  Abd: soft, nt  Neuro: MS: Aware, alert, fully oriented, no neglect, naming more briskly today CN: VFF to counting fingers,  Motor: Right upper extremity weakness continues to improve with marked improvement in her finger dexterity though this is still reduced from baseline.  She is using her left upper extremity spontaneously without any issues. Sensory: No longer has any sensory deficit on the right side   Pertinent Labs:  Basic Metabolic Panel: Recent Labs  Lab 04/29/22 0810 05/01/22 0423  NA 136 136  K 4.6 3.8  CL 105 103  CO2 20* 23  GLUCOSE 169* 148*  BUN 10 14  CREATININE 0.60 0.53  CALCIUM 9.5 9.3     CBC: Recent Labs  Lab 04/29/22 0810 05/01/22 0423  WBC 3.7* 6.7  NEUTROABS 2.0  --   HGB 11.6* 10.6*  HCT 35.2* 32.1*  MCV 86.7 85.6  PLT 101* 101*     Coagulation Studies: Recent Labs     04/29/22 0810  LABPROT 14.3  INR 1.1     Lab Results  Component Value Date   HGBA1C 5.7 (H) 04/29/2022   Lab Results  Component Value Date   VITAMINB12 350 04/30/2022     CTA reviewed:  1. 6 mm left upper lobe pulmonary nodule. As a metastatic lesion cannot be excluded, close follow-up recommended. Consider repeat CT chest in 3 months. 2. No findings to suggest primary malignancy in the chest, abdomen, or pelvis. No findings to suggest metastatic disease in the abdomen or pelvis. 3. 2.4 cm heterogeneous right thyroid nodule. Recommend thyroid US (ref: J Am Coll Radiol. 2015 Feb;12(2): 143-50). 4. Cirrhotic liver with splenomegaly. 5. Mild wall thickening gastric antrum, potentially related to peristalsis although antritis or less likely, neoplasm could have this appearance. 6. Aortic Atherosclerosis (ICD10-I70.0).   CTA head / neck personally reviewed, agree with radiology:   1. Negative for large vessel occlusion. Positive for moderate proximal Left ICA and bilateral supraclinoid ICA calcified atherosclerosis and up to 50% stenosis. Mild bilateral vertebral artery stenosis.  2. 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 No new intracranial abnormality.   Impression: Unexplained multifocal hemorrhages, highly concerning for metastatic disease though ABRA/CAA-ri would also be on the differential. Less likely cavernomas given rapid development of new lesions, very atypical for ischemic strokes with secondary hemorrhagic conversion (will continue telemetry, to eval for occult afib, event monitor already  planned outpatient)  Recommendations: # Progressive multifocal hemorrhagic brain lesions - Plan for follow-up with Dr. Lacinda Axon Tuesday  - Outpatient follow-up with GI for gastric antrum findings on CTA, appreciate Dr. 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 -Continue Keppra 500 twice daily -Please include seizure precautions below in discharge instructions, and I reviewed these with patient and husband at bedside  # Low normal B12 > 12/25/2021 270 - repeat B12 (350 here) and MMA (pending) - B12 1000 mcg daily, goal B12 level > 500   # Hypertension -Patient notes that her outpatient medications were recently adjusted and would like her regimen updated to these recent adjustments prior to discharge if primary team agrees  Standard seizure precautions: Per [facility] statutes, patients with seizures are not allowed to drive until  they have been seizure-free for six months. Use caution when using heavy equipment or power tools. Avoid working on ladders or at heights. Take showers instead of baths. Ensure the water temperature is not too high on the home water heater. Do not go swimming alone. When caring for infants or small children, sit down when holding, feeding, or changing them to minimize risk of injury to the child in the event you have a seizure.  To reduce risk of seizures, maintain good sleep hygiene avoid alcohol and illicit drug use, take all anti-seizure medications as prescribed.    Lesleigh Noe MD-PhD [facility] [phone]   Greater than 50 min spent in care of this patient, the majority at bedside in discussion with husband and patient

## 2022-05-01 NOTE — Evaluation (Signed)
Physical Therapy Evaluation Patient Details Name: SYERRA ABDELRAHMAN MRN: [id number] DOB: [birth year] Today's Date: 05/01/2022  History of Present Illness  Patient is a 81 year old female with admitted with tremors. 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 New MRI obtained demonstrates additional lesions  Clinical Impression  Patient was very cooperative and agreeable to PT evaluation. She reports she used a cane or rolling walker intermittently at home prior to admission. She lives with her supportive spouse in a single story home with no steps to enter.  Today, the patient has mild right leg weakness. She has some decreased coordination with placement of the right hand on the rolling walker initially with walking. She ambulated in the hallway with and without rolling walker. Improved gait kinematics and balance with using of rolling walker which was recommended to use at home at this time. The patient is hopeful to have HHPT which is appropriate at this time. Recommend PT follow up while in the hospital to maximize independence and decrease caregiver burden.      Recommendations for follow up therapy are one component of a multi-disciplinary discharge planning process, led by the attending physician.  Recommendations may be updated based on patient status, additional functional criteria and insurance authorization.  Follow Up Recommendations Home health PT      Assistance Recommended at Discharge Intermittent Supervision/Assistance  Patient can return home with the following  A little help with bathing/dressing/bathroom;Assist for transportation;Assistance with cooking/housework    Equipment Recommendations None recommended by PT  Recommendations  for Other Services       Functional Status Assessment Patient has had a recent decline in their functional status and demonstrates the ability to make significant improvements in function in a reasonable and predictable amount of time.     Precautions / Restrictions Precautions Precautions: Fall Restrictions Weight Bearing Restrictions: No      Mobility  Bed Mobility               General bed mobility comments: not assessed as patient sitting up on arrival and post session    Transfers Overall transfer level: Needs assistance Equipment used: Rolling walker (2 wheels) Transfers: Sit to/from Stand Sit to Stand: Min guard           General transfer comment: verbal cues for hand placement for safety    Ambulation/Gait Ambulation/Gait assistance: Min guard, Supervision Gait Distance (Feet): 120 Feet Assistive device: Rolling walker (2 wheels), None (with and without rolling walker) Gait Pattern/deviations: Step-through pattern, Decreased stride length, Narrow base of support Gait velocity: decreased     General Gait Details: Patient ambulated with and without rolling walker. Without rolling walker, patient has very narrow base of support with decreased stride length and decreased symmetry with step lengths. With the rolling walker, patient has improved step length and and wider base of support. Initial cues for right arm placement with decreased coordination on rolling walker with the right hand.  Stairs            Wheelchair Mobility    Modified Rankin (Stroke Patients Only)       Balance Overall balance assessment: Needs assistance Sitting-balance support: Feet supported Sitting balance-Leahy Scale: Good     Standing balance support: Bilateral upper extremity supported, During functional activity, No upper extremity supported Standing balance-Leahy Scale: Fair  Pertinent Vitals/Pain      Home Living  Family/patient expects to be discharged to:: Private residence Living Arrangements: Spouse/significant other Available Help at Discharge: Family;Available 24 hours/day Type of Home: House Home Access: Level entry       Home Layout: One level Home Equipment: Shower seat;Grab bars - tub/shower;Cane - single Barista (2 wheels) Additional Comments: uses a standard walker for support in the shower    Prior Function Prior Level of Function : Independent/Modified Independent             Mobility Comments: Mod I with 2 wheeled walker or SPC. uses DME PRN. has had balance issues since brain biopsy ADLs Comments: Mod I     Hand Dominance   Dominant Hand: Right    Extremity/Trunk Assessment   Upper Extremity Assessment Upper Extremity Assessment: Generalized weakness    Lower Extremity Assessment Lower Extremity Assessment: Generalized weakness RLE Deficits / Details: hip flexor 4/5, knee extension 4+/5, dorsiflexion and plantarflexion 5/5 RLE Sensation: WNL RLE Coordination: WNL LLE Deficits / Details: hip flexor 5/5, knee extension 5/5, dorsiflexion and plantarflexion 5/5 LLE Sensation: WNL LLE Coordination: WNL       Communication   Communication: No difficulties  Cognition Arousal/Alertness: Awake/alert Behavior During Therapy: WFL for tasks assessed/performed Overall Cognitive Status: Within Functional Limits for tasks assessed Area of Impairment: Memory                     Memory:  (possible impaired long term memory. 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))                  General Comments      Exercises     Assessment/Plan    PT Assessment Patient needs continued PT services  PT Problem List Decreased strength;Decreased range of motion;Decreased activity tolerance;Decreased balance;Decreased mobility;Decreased coordination;Decreased safety  awareness       PT Treatment Interventions Gait training;DME instruction;Stair training;Functional mobility training;Therapeutic activities;Therapeutic exercise;Balance training;Neuromuscular re-education;Cognitive remediation;Patient/family education    PT Goals (Current goals can be found in the Care Plan section)  Acute Rehab PT Goals Patient Stated Goal: to go home PT Goal Formulation: With patient Time For Goal Achievement: 05/15/22 Potential to Achieve Goals: Good    Frequency Min 2X/week     Co-evaluation PT/OT/SLP Co-Evaluation/Treatment: Yes Reason for Co-Treatment: For patient/therapist safety;To address functional/ADL transfers PT goals addressed during session: Mobility/safety with mobility;Balance;Strengthening/ROM OT goals addressed during session: ADL's and self-care;Proper use of Adaptive equipment and DME;Strengthening/ROM       AM-PAC PT "6 Clicks" Mobility  Outcome Measure Help needed turning from your back to your side while in a flat bed without using bedrails?: None Help needed moving from lying on your back to sitting on the side of a flat bed without using bedrails?: A Little Help needed moving to and from a bed to a chair (including a wheelchair)?: A Little Help needed standing up from a chair using your arms (e.g., wheelchair or bedside chair)?: A Little Help needed to walk in hospital room?: A Little Help needed climbing 3-5 steps with a railing? : A Little 6 Click Score: 19    End of Session Equipment Utilized During Treatment: Gait belt Activity Tolerance: Patient tolerated treatment well Patient left: in chair;with call bell/phone within reach Nurse Communication: Mobility status PT Visit Diagnosis: Other abnormalities of gait and mobility (R26.89);Unsteadiness on feet (R26.81)    Time:  2111-5520 PT Time Calculation (min) (ACUTE ONLY): 22 min   Charges:   PT Evaluation $PT Eval Low Complexity: 1 Low         Minna Merritts, PT,  MPT   Percell Locus 05/01/2022, 9:35 AM

## 2022-05-01 NOTE — Progress Notes (Addendum)
2000 Patient unable to do ADL care due to right arm weakness and dexterity in hands patient unable to manipulate anything she holds no complaints of pain balance is also a concern max assistance needed 2100 patient nit wanting to have bath at this time did brush teeth and wash face

## 2022-05-01 NOTE — TOC Transition Note (Signed)
Transition of Care [facility]) - CM/SW Discharge Note   Patient Details  Name: ALETHIA MELENDREZ MRN: [id number] Date of Birth: Jun 04, 1941  Transition of Care [facility]) CM/SW Contact:  Shelbie Hutching, RN Phone Number: 05/01/2022, 10:22 AM   Clinical Narrative:    Patient is medically stable for discharge home today with home health services.  Corene Cornea with [facility] health has accepted referral for PT, OT, and speech.  Patient's husband will transport her home.     Final next level of care: Home w Home Health Services Barriers to Discharge: Barriers Resolved   Patient Goals and CMS Choice   CMS Medicare.gov Compare Post Acute Care list provided to:: Patient Choice offered to / list presented to : Patient  Discharge Placement                       Discharge Plan and Services   Discharge Planning Services: CM Consult Post Acute Care Choice: Home Health          DME Arranged: N/A DME Agency: NA       HH Arranged: PT, OT, Speech Therapy [place] Agency: [place] ([place]) Date HH Agency Contacted: 05/01/22 Time [place]: 1022 Representative spoke with at [place]: [place] ([place]) Interventions     Readmission Risk Interventions     No data to display

## 2022-05-01 NOTE — Evaluation (Signed)
Occupational Therapy Evaluation Patient Details Name: Whitney [name] MRN: [id number] DOB: January 17, 1941 Today's Date: 05/01/2022   History of Present Illness Patient is a 81 year old female with admitted with tremors. 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 New MRI obtained demonstrates additional lesions   Clinical Impression   Patient presenting with decreased independence in self-care, functional mobility, safety, balance, and strength/endurance. Patient reports she lives at home with her husband who is able to provide 24/7 assistance at discharge. Patient reports she has a walk-in shower, standard RW for shower, comfort height toilet, SPC, and RW. AT baseline, patient reports mod I. Patient currently functioning at min guard for transfer sit<> stand using RW. LB clothing manipulation observed with min guard. Patient was able to ambulate ~100 ft using RW. Ambulation attempted without RW, uneven steps with gait observed, patient very cautious with ambulation. Patient placed back in recliner, with call bell in reach and all need met.  Patient will benefit from acute OT to increase overall independence in the areas of ADLs, functional mobility, in order to safely discharge home.       Recommendations for follow up therapy are one component of a multi-disciplinary discharge planning process, led by the attending physician.  Recommendations may be updated based on patient status, additional functional criteria and insurance authorization.   Follow Up Recommendations  Home health OT    Assistance Recommended at Discharge Frequent or constant Supervision/Assistance  Patient can return home with the following A little help with walking and/or transfers;A  little help with bathing/dressing/bathroom;Help with stairs or ramp for entrance;Assist for transportation;Assistance with cooking/housework    Functional Status Assessment  Patient has had a recent decline in their functional status and demonstrates the ability to make significant improvements in function in a reasonable and predictable amount of time.  Equipment Recommendations  None recommended by OT       Precautions / Restrictions Precautions Precautions: Fall Restrictions Weight Bearing Restrictions: No      Mobility Bed Mobility               General bed mobility comments: Patient in recliner upon arrival.    Transfers Overall transfer level: Needs assistance Equipment used: Rolling walker (2 wheels) Transfers: Sit to/from Stand Sit to Stand: Min guard                  Balance Overall balance assessment: Needs assistance Sitting-balance support: Feet supported Sitting balance-Leahy Scale: Good     Standing balance support: Bilateral upper extremity supported, Reliant on assistive device for balance, During functional activity Standing balance-Leahy Scale: Fair                             ADL either performed or assessed with clinical judgement   ADL Overall ADL's : Needs assistance/impaired                     Lower Body Dressing: Min guard                        Pertinent Vitals/Pain Pain Assessment Pain Assessment: No/denies pain     Hand Dominance Right   Extremity/Trunk Assessment Upper Extremity Assessment Upper Extremity Assessment: Generalized weakness   Lower Extremity Assessment Lower  Extremity Assessment: Generalized weakness       Communication Communication Communication: No difficulties   Cognition Arousal/Alertness: Awake/alert Behavior During Therapy: WFL for tasks assessed/performed Overall Cognitive Status: Within Functional Limits for tasks assessed                                                   Home Living Family/patient expects to be discharged to:: Private residence Living Arrangements: Spouse/significant other Available Help at Discharge: Family;Available 24 hours/day Type of Home: House Home Access: Level entry     Home Layout: One level     Bathroom Shower/Tub: Occupational psychologist: Handicapped height     Home Equipment: Shower seat;Grab bars - tub/shower;Cane - single Barista (2 wheels)   Additional Comments: uses a standard walker for support in the shower  Lives With: Spouse    Prior Functioning/Environment Prior Level of Function : Independent/Modified Independent             Mobility Comments: Mod I with 2 wheeled walker or SPC. uses DME PRN. has had balance issues since brain biopsy ADLs Comments: Mod I        OT Problem List: Decreased activity tolerance;Decreased safety awareness      OT Treatment/Interventions: Self-care/ADL training;Therapeutic exercise;Therapeutic activities;Patient/family education    OT Goals(Current goals can be found in the care plan section) Acute Rehab OT Goals Patient Stated Goal: to return home OT Goal Formulation: With patient Time For Goal Achievement: 05/15/22 Potential to Achieve Goals: Good ADL Goals Pt Will Perform Lower Body Bathing: with modified independence Pt Will Perform Lower Body Dressing: with modified independence Pt/caregiver will Perform Home Exercise Program: Increased strength;Right Upper extremity;With theraputty;Independently  OT Frequency: Min 2X/week    Co-evaluation PT/OT/SLP Co-Evaluation/Treatment: Yes Reason for Co-Treatment: For patient/therapist safety;To address functional/ADL transfers PT goals addressed during session: Mobility/safety with mobility;Balance;Strengthening/ROM OT goals addressed during session: ADL's and self-care;Proper use of Adaptive equipment and DME;Strengthening/ROM      AM-PAC OT "6 Clicks"  Daily Activity     Outcome Measure Help from another person eating meals?: A Little Help from another person taking care of personal grooming?: A Little Help from another person toileting, which includes using toliet, bedpan, or urinal?: A Little Help from another person bathing (including washing, rinsing, drying)?: A Little Help from another person to put on and taking off regular upper body clothing?: A Little Help from another person to put on and taking off regular lower body clothing?: A Little 6 Click Score: 18   End of Session Equipment Utilized During Treatment: Rolling walker (2 wheels);Gait belt Nurse Communication: Mobility status  Activity Tolerance: Patient tolerated treatment well Patient left: in chair;with call bell/phone within reach  OT Visit Diagnosis: Muscle weakness (generalized) (M62.81);Other abnormalities of gait and mobility (R26.89)                Time: 8786-7672 OT Time Calculation (min): 21 min Charges:  OT General Charges $OT Visit: 1 Visit OT Evaluation $OT Eval Low Complexity: 1 Low    [place], OTS 05/01/2022, 9:32 AM

## 2022-05-01 NOTE — Progress Notes (Addendum)
0600 patient up to commode than helped with bath patient used right hand, patient able to wash most of her body however she did drop the wash rag a few times. Balance with standing and transfers was min assist improved from max assist yesterday. Patient had difficulty with brushing teeth still with right hand, Patient may benefit from a device that can make the handle larger for better dexterity. (I'm not sure how she does with meals and utensils?may need a larger handle with those too? )   Patient has concerns of going home and not being able to do for herself she lives with her husband but states he can not and she would not want him to do all of these things for her she would want to be able to provide own ADL care before actually going home.

## 2022-05-02 NOTE — Discharge Summary (Signed)
Physician Discharge Summary   Patient: Whitney [name] MRN: [id number] DOB: 1940/10/20  Admit date:     04/29/2022  Discharge date: 05/01/2022  Discharge Physician: Whitney [name]   PCP: Whitney Salvo, MD   Recommendations at discharge:   Follow-up with outpatient providers as requested  Discharge Diagnoses: Principal Problem:   Focal seizure ([place]) Active Problems:   Brain lesion   HTN (hypertension)   Pancytopenia (HCC)   Diabetes mellitus type 2, controlled, without complications ([place])   OSA on CPAP   Hemorrhagic cerebrovascular accident (CVA) [facility])  Hospital Course:  81 y.o. female with medical history significant of multifocal supra and infratentorial hemorrhagic lesions, colon cancer, type 2 diabetes, cirrhosis secondary to NASH, CVA, hypertension, hyperlipidemia admitted for seizure and was found to have multifocal hemorrhages highly concerning for possible metastatic disease  10/26: Repeat CT today shows no findings suggestive of primary malignancy in chest abdomen pelvis.  Repeat CT head showed stable appearance  Assessment and Plan: * Focal seizure ([place]) Focal seizure with right arm tremors in the setting of multiple brain lesions.  Patient was given Ativan with resolution of symptoms and subsequently loaded on Keppra.   -Continue Keppra 500 mg p.o. twice daily - Standard seizure precautions: Per [facility] statutes, patients with seizures are not allowed to drive until  they have been seizure-free for six months. Use caution when using heavy equipment or power tools. Avoid working on ladders or at heights. Take showers instead of baths. Ensure the water temperature is not too high on the home water heater. Do not go swimming alone. When caring for infants or small children, sit down when holding, feeding, or changing them to minimize risk of injury to the child in the event you have a seizure.  To reduce risk of seizures, maintain good sleep hygiene avoid alcohol and  illicit drug use, take all anti-seizure medications as prescribed.   Brain lesion Multifocal hemorrhages highly concerning for metastatic disease  Less likely cavernous and very atypical for ischemic stroke with secondary hemorrhagic conversion per neurology evaluation here Per chart review, patient was diagnosed with multifocal lesions within the supratentorial and infratentorial brain consistent with hemorrhagic lesions multiple hemorrhagic lesions involving the supratentorial and infratentorial brain after experiencing dizziness.  She has since established with vascular neurology at [facility].  Biopsy was obtained that was negative for vasculitis or malignancy.  CT chest/abdomen/pelvis and PET scan was negative for malignancy.   CT and MRI obtained on this admission demonstrates additional lesions.  - Neurology following and appreciate their recommendations -Repeat CT head shows stable findings from before -CT chest/abdomen/pelvis repeated this admission shows no obvious lesions suggestive of primary malignancy there -She needs to follow-up outpatient neurosurgery appointment scheduled with Whitney [name] next Tuesday  -She will need outpatient follow-up with GI for gastric antrum findings on CTA   Pancytopenia [facility]) Patient has a history of anemia, however leukopenia and thrombocytopenia appear new.  Thrombocytopenia can be explained by patient's history of cirrhosis, however uncertain why patient is developed leukopenia.  Patient has already established with hematology at [facility].  - Continue outpatient follow-up with [facility] hematology She does have low normal B12 of 270.  Repeat B12 is 350. continue vitamin B12 at discharge  HTN (hypertension) Continue Coreg, losartan, Norvasc  Diabetes mellitus type 2, controlled, without complications (HCC) - Z9D 5.7  OSA on CPAP - CPAP as needed         Consultants: Neurology Disposition: Home health Diet recommendation:  Discharge Diet  Orders  (From admission, onward)     Start     Ordered   05/01/22 0000  Diet - low sodium heart healthy        05/01/22 1009           Carb modified diet DISCHARGE MEDICATION: Allergies as of 05/01/2022       Reactions   Lactose Other (See Comments)   Lactose Intolerance  Lactose intolerance diarrhea.   Oxycodone Itching   Propoxyphene Other (See Comments)   REACTION: caused BP to elevate Other Reaction: CNS Disorder REACTION: caused BP to elevate REACTION: caused BP to elevate Other Reaction: CNS Disorder   Sulfa Antibiotics Rash   Chlorthalidone Other (See Comments)   Hyponatremia Hyponatremia Other reaction(s): Other (see comments) Hyponatremia Hyponatremia More of a intolerance per pt  Hyponatremia Hyponatremia More of a intolerance per pt  Hyponatremia Hyponatremia   Codeine Itching   Epinephrine    REACTION: causes BP to elevate   Ibuprofen    REACTION: causes BP to elevate   Iohexol     Code: RASH, Desc: IV CONTRAST REACTION 49YRS AGO, OK TODAY W/ 25MG BENADRYL PO JUST PRIOR TO EXAM/A.C., Onset Date: [id number]   Penicillins    REACTION: rash   Propoxyphene N-acetaminophen    REACTION: caused BP to elevate   Tramadol    Other reaction(s): Insomnia   Dexamethasone Rash   Hydrocodone Rash   Nitrofurantoin Nausea Only   Nausea/diarrhea Nausea/diarrhea Nausea/diarrhea Nausea/diarrhea        Medication List     TAKE these medications    amLODipine 5 MG tablet Commonly known as: NORVASC Take 5 mg by mouth 2 (two) times daily.   carvedilol 12.5 MG tablet Commonly known as: COREG Take 12.5 mg by mouth 2 (two) times daily.   clonazePAM 0.5 MG tablet Commonly known as: KLONOPIN Take 0.5 mg by mouth 2 (two) times daily as needed for anxiety.   cyanocobalamin 1000 MCG tablet Take 1 tablet (1,000 mcg total) by mouth daily.   escitalopram 10 MG tablet Commonly known as: LEXAPRO Take 10 mg by mouth daily.   estradiol 0.1 MG/GM vaginal  cream Commonly known as: ESTRACE Place 1 Applicatorful vaginally 2 (two) times a week.   ferrous sulfate 325 (65 FE) MG tablet Take 325 mg by mouth every other day. Takes w/ vitC   fexofenadine 180 MG tablet Commonly known as: ALLEGRA Take 180 mg by mouth daily as needed for allergies.   Fifty50 Glucose Meter 2.0 w/Device Kit Frequency:PHARMDIR   Dosage:0.0     Instructions:  Note:microfill contour strips BID Dose: 1   fluticasone 50 MCG/ACT nasal spray Commonly known as: FLONASE Place 1 spray into both nostrils daily.   gabapentin 300 MG capsule Commonly known as: NEURONTIN Take 300 mg by mouth 3 (three) times daily.   levETIRAcetam 500 MG tablet Commonly known as: Keppra Take 1 tablet (500 mg total) by mouth 2 (two) times daily.   losartan 50 MG tablet Commonly known as: COZAAR Take 50 mg by mouth in the morning and at bedtime.   meclizine 25 MG tablet Commonly known as: ANTIVERT Take 25 mg by mouth every 8 (eight) hours as needed.   metFORMIN 500 MG 24 hr tablet Commonly known as: GLUCOPHAGE-XR Take 500-1,000 mg by mouth 2 (two) times daily. 500 mg every morning and 1000 mg every evening   methocarbamol 500 MG tablet Commonly known as: ROBAXIN TAKE 1 TABLET BY MOUTH FOUR TIMES A DAY AS NEEDED  multivitamin with minerals tablet Take 1 tablet by mouth daily.   omeprazole 20 MG capsule Commonly known as: PRILOSEC Take 20 mg by mouth daily.   pravastatin 10 MG tablet Commonly known as: PRAVACHOL Take 10 mg by mouth daily.   traZODone 50 MG tablet Commonly known as: DESYREL Take 50 mg by mouth at bedtime.   Vitamin D3 50 MCG (2000 UT) capsule Take by mouth.        Follow-up Information     Whitney Salvo, MD. Schedule an appointment as soon as possible for a visit in 1 week(s).   Specialty: Pediatrics Why: [facility] Discharge F/UP Contact information: [address] [place] [address] [phone]         Deetta Perla, MD. Go  on 05/05/2022.   Specialty: Neurosurgery Why: as scheduled Contact information: [place] [id number] [facility].   Why: [facility] has been arranged and they will be calling to set up your initial visit.               Discharge Exam: Filed Weights   04/29/22 0818  Weight: 85.68 kg   81 year old female lying in the bed comfortably without any acute distress Lungs clear to auscultation bilaterally Heart regular rate and rhythm Abdomen soft, benign Neuro alert and awake, right upper and lower extremity 4 out of 5 strength.  Left side 5 out of 5 Skin no rash or lesion  Condition at discharge: fair  The results of significant diagnostics from this hospitalization (including imaging, microbiology, ancillary and laboratory) are listed below for reference.   Imaging Studies: CT CHEST ABDOMEN PELVIS W CONTRAST  Result Date: 04/30/2022 CLINICAL DATA:  Brain lesions suspicious for metastatic disease. Evaluate for primary malignancy. * Tracking Code: BO * EXAM: CT CHEST, ABDOMEN, AND PELVIS WITH CONTRAST TECHNIQUE: Multidetector CT imaging of the chest, abdomen and pelvis was performed following the standard protocol during bolus administration of intravenous contrast. RADIATION DOSE REDUCTION: This exam was performed according to the departmental dose-optimization program which includes automated exposure control, adjustment of the mA and/or kV according to patient size and/or use of iterative reconstruction technique. CONTRAST:  39m OMNIPAQUE IOHEXOL 350 MG/ML SOLN COMPARISON:  CTA abdomen and pelvis 05/12/2017. FINDINGS: CT CHEST FINDINGS Cardiovascular: The heart size is normal. No substantial pericardial effusion. Coronary artery calcification is evident. Moderate atherosclerotic calcification is noted in the wall of the thoracic aorta. Mediastinum/Nodes: 2.4 cm heterogeneous right thyroid nodule evident. No  mediastinal lymphadenopathy. 9 mm short axis subcarinal node is upper normal for size. There is no hilar lymphadenopathy. The esophagus has normal imaging features. There is no axillary lymphadenopathy. Lungs/Pleura: 6 mm left upper lobe pulmonary nodule identified on 67/9. No other suspicious pulmonary nodule or mass. No focal airspace consolidation. No pleural effusion. Musculoskeletal: No worrisome lytic or sclerotic osseous abnormality. CT ABDOMEN PELVIS FINDINGS Hepatobiliary: Nodular liver is compatible with cirrhosis. 14 mm low-density lesion lateral segment left liver is similar to prior and compatible with a cyst. No followup imaging is recommended. Tiny hypodensity in the hepatic dome is too small to characterize. No overtly suspicious hypervascular lesion evident. Gallbladder surgically absent. No intrahepatic or extrahepatic biliary dilation. Pancreas: No focal mass lesion. No dilatation of the main duct. No intraparenchymal cyst. No peripancreatic edema. Spleen: Splenomegaly (estimated volume 808 cc). Adrenals/Urinary Tract: No adrenal nodule or mass. Kidneys unremarkable. No evidence for hydroureter.  The urinary bladder appears normal for the degree of distention. Stomach/Bowel: Stomach is unremarkable. Mild wall thickening noted gastric antrum, potentially related to peristalsis although antritis or could have this appearance. No evidence of outlet obstruction. Duodenum is normally positioned as is the ligament of Treitz. No small bowel wall thickening. No small bowel dilatation. Status post cecectomy. No gross colonic mass. No colonic wall thickening. Vascular/Lymphatic: There is moderate atherosclerotic calcification of the abdominal aorta without aneurysm. There is no gastrohepatic or hepatoduodenal ligament lymphadenopathy. No retroperitoneal or mesenteric lymphadenopathy. No pelvic sidewall lymphadenopathy. Reproductive: Hysterectomy.  There is no adnexal mass. Other: No intraperitoneal free  fluid. Musculoskeletal: No worrisome lytic or sclerotic osseous abnormality. IMPRESSION: 1. 6 mm left upper lobe pulmonary nodule. As a metastatic lesion cannot be excluded, close follow-up recommended. Consider repeat CT chest in 3 months. 2. No findings to suggest primary malignancy in the chest, abdomen, or pelvis. No findings to suggest metastatic disease in the abdomen or pelvis. 3. 2.4 cm heterogeneous right thyroid nodule. Recommend thyroid US (ref: J Am Coll Radiol. 2015 Feb;12(2): 143-50). 4. Cirrhotic liver with splenomegaly. 5. Mild wall thickening gastric antrum, potentially related to peristalsis although antritis or less likely, neoplasm could have this appearance. 6.  Aortic Atherosclerosis (ICD10-I70.0). Electronically Signed   By: Misty Stanley M.D.   On: 04/30/2022 10:25   CT ANGIO HEAD NECK W WO CM  Result Date: 04/30/2022 CLINICAL DATA:  81 year old female with extensive hemorrhagic brain metastases. Metastatic colon cancer. EXAM: CT ANGIOGRAPHY HEAD AND NECK TECHNIQUE: Multidetector CT imaging of the head and neck was performed using the standard protocol during bolus administration of intravenous contrast. Multiplanar CT image reconstructions and MIPs were obtained to evaluate the vascular anatomy. Carotid stenosis measurements (when applicable) are obtained utilizing NASCET criteria, using the distal internal carotid diameter as the denominator. RADIATION DOSE REDUCTION: This exam was performed according to the departmental dose-optimization program which includes automated exposure control, adjustment of the mA and/or kV according to patient size and/or use of iterative reconstruction technique. CONTRAST:  64m OMNIPAQUE IOHEXOL 350 MG/ML SOLN COMPARISON:  Brain MRI, intracranial MRV yesterday. Head CT yesterday. CT Chest, Abdomen, and Pelvis today are reported separately. FINDINGS: CT HEAD Brain: Stable non contrast CT appearance of the brain. No increasing cerebral edema or significant  intracranial mass effect. No acute intracranial hemorrhage. Calvarium and skull base: Stable small right posterior convexity craniotomy. Paranasal sinuses: Visualized paranasal sinuses and mastoids are stable and well aerated. Orbits: Stable orbit and scalp soft tissues. CTA NECK Skeleton: Calcified disc herniation or ossification of the posterior longitudinal ligament at T1-T2. No destructive or suspicious osseous lesion identified in the neck. Upper chest: Chest CT today is reported separately. Other neck: Heterogeneous thyroid enlargement, thyroid goiter In the setting of significant comorbidities or limited life expectancy, no follow-up recommended (ref: J Am Coll Radiol. 2015 Feb;12(2): 143-50).No superimposed neck mass or lymphadenopathy. Aortic arch: Calcified aortic atherosclerosis. Three vessel arch configuration. Right carotid system: Mild brachiocephalic and right CCA origin plaque without stenosis. Calcified plaque at the right ICA origin and bulb without stenosis. Left carotid system: Mild tortuosity. Negative left CCA. Moderate calcified plaque at the left ICA origin and bulb with 50 % stenosis with respect to the distal vessel (series 12, image 125). No additional stenosis to the skull base. Vertebral arteries: Minimal plaque in the proximal right subclavian artery and at the right vertebral artery origin without stenosis. Right vertebral artery otherwise negative to the skull base. Moderate calcified proximal left subclavian  artery plaque. Calcified plaque approaching the left vertebral artery origin, but only mild origin stenosis on series 12, image 158. Tortuous left V1 segment. No other left vertebral plaque or stenosis to the skull base. CTA HEAD Posterior circulation: Mildly dominant left V4 segment. Circumferential calcified plaque in the cisterna magna proximal to the PICA origin but only mild stenosis on series 11, image 140. Mild right V4 segment irregularity and stenosis. Patent  vertebrobasilar junction and basilar artery without stenosis. Patent SCA and PCA origins. Tortuous right P1 segment. Posterior communicating arteries are diminutive or absent. Bilateral PCA branches are within normal limits. Anterior circulation: Both ICA siphons are patent. Moderate calcified plaque on the left and mild to moderate supraclinoid stenosis up to 50%. Contralateral right ICA moderate calcified plaque and moderate roughly 50% supraclinoid ICA stenosis. Patent carotid termini, MCA and ACA origins. Bilateral ACA branches are within normal limits. Left MCA M1 segment and bifurcation are patent without stenosis. Right MCA M1 segment and bifurcation are patent without stenosis. Bilateral MCA branches are within normal limits. Venous sinuses: Patent. Anatomic variants: None significant. Review of the MIP images confirms the above findings IMPRESSION: 1. Negative for large vessel occlusion. Positive for moderate proximal Left ICA and bilateral supraclinoid ICA calcified atherosclerosis and up to 50% stenosis. Mild bilateral vertebral artery stenosis. 2. 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 No new intracranial abnormality. 3.  CT Chest, Abdomen, and Pelvis today are reported separately. Electronically Signed   By: Genevie Ann M.D.   On: 04/30/2022 09:49   MR BRAIN W WO CONTRAST  Result Date: 04/29/2022 CLINICAL DATA:  Stroke, follow up; Stroke, hemorrhagic rule out CVST EXAM: MRI HEAD WITHOUT AND WITH CONTRAST MRV HEAD WITHOUT CONTRAST TECHNIQUE: Multiplanar, multiecho pulse sequences of the brain and surrounding structures were obtained without and with intravenous contrast. Angiographic images of the intracranial venous structures were obtained using MRV technique without intravenous contrast. CONTRAST:  43m GADAVIST GADOBUTROL 1 MMOL/ML IV SOLN COMPARISON:  CT head from the same day. FINDINGS: MRI head: Brain: There are many hemorrhagic masses within the infratentorial and  supratentorial brain with suggestion of some superimposed enhancement although intrinsic blood products limits assessment for enhancement. Index lesions on series 1040 include: 1.1 cm lesion in the posterior cerebellum (series 1040, image 66) 1.5 cm lesion in the right occipital lobe (image 82). 1.1 cm lesion the right occipital lobe (image 90). 4.5 mm left para hippocampal lesion (image 95). 1.1 cm lesion in the left frontal white matter (image 110). 5 mm lesion in the left frontal cortex (image 112). 3 mm lesion in the right frontal white matter (image 123). 2.0 cm lesion in the right parietal lobe (image 132). 1.3 cm lesion in the left parietal lobe (image 134). 4 mm lesion in the left parietal lobe (image 143). Edema and regional mass effect surrounding these lesions without midline shift. No evidence of acute infarct, midline shift, hydrocephalus. No acute hemorrhage outside of the above lesions. Vascular: Major arterial flow voids are maintained skull base. Skull and upper cervical spine: Prior right posterior craniotomy. Otherwise, normal marrow signal. Sinuses and mastoid air cells: Mild paranasal sinus mucosal thickening. Largely clear mastoid air cells. MRV: No evidence of dural venous sinus thrombosis. The superior sagittal, transverse, sigmoid and straight sinuses are patent. Visualized deep cerebral veins are patent. Scattered small benign arachnoid granulations. IMPRESSION: 1. Numerous hemorrhagic lesions throughout the brain, described above and most suspicious for hemorrhagic metastases. 2. No evidence of dural venous sinus thrombosis.  Electronically Signed   By: Margaretha Sheffield M.D.   On: 04/29/2022 13:16   MR MRV HEAD W WO CONTRAST  Result Date: 04/29/2022 CLINICAL DATA:  Stroke, follow up; Stroke, hemorrhagic rule out CVST EXAM: MRI HEAD WITHOUT AND WITH CONTRAST MRV HEAD WITHOUT CONTRAST TECHNIQUE: Multiplanar, multiecho pulse sequences of the brain and surrounding structures were obtained  without and with intravenous contrast. Angiographic images of the intracranial venous structures were obtained using MRV technique without intravenous contrast. CONTRAST:  74m GADAVIST GADOBUTROL 1 MMOL/ML IV SOLN COMPARISON:  CT head from the same day. FINDINGS: MRI head: Brain: There are many hemorrhagic masses within the infratentorial and supratentorial brain with suggestion of some superimposed enhancement although intrinsic blood products limits assessment for enhancement. Index lesions on series 1040 include: 1.1 cm lesion in the posterior cerebellum (series 1040, image 66) 1.5 cm lesion in the right occipital lobe (image 82). 1.1 cm lesion the right occipital lobe (image 90). 4.5 mm left para hippocampal lesion (image 95). 1.1 cm lesion in the left frontal white matter (image 110). 5 mm lesion in the left frontal cortex (image 112). 3 mm lesion in the right frontal white matter (image 123). 2.0 cm lesion in the right parietal lobe (image 132). 1.3 cm lesion in the left parietal lobe (image 134). 4 mm lesion in the left parietal lobe (image 143). Edema and regional mass effect surrounding these lesions without midline shift. No evidence of acute infarct, midline shift, hydrocephalus. No acute hemorrhage outside of the above lesions. Vascular: Major arterial flow voids are maintained skull base. Skull and upper cervical spine: Prior right posterior craniotomy. Otherwise, normal marrow signal. Sinuses and mastoid air cells: Mild paranasal sinus mucosal thickening. Largely clear mastoid air cells. MRV: No evidence of dural venous sinus thrombosis. The superior sagittal, transverse, sigmoid and straight sinuses are patent. Visualized deep cerebral veins are patent. Scattered small benign arachnoid granulations. IMPRESSION: 1. Numerous hemorrhagic lesions throughout the brain, described above and most suspicious for hemorrhagic metastases. 2. No evidence of dural venous sinus thrombosis. Electronically Signed    By: FMargaretha SheffieldM.D.   On: 04/29/2022 13:16   CT HEAD WO CONTRAST  Result Date: 04/29/2022 CLINICAL DATA:  81year old female with seizure like activity right upper extremity. Suspected cerebral metastatic disease by CT in August and history of cancer (liver versus colon according to prior imaging reports). EXAM: CT HEAD WITHOUT CONTRAST TECHNIQUE: Contiguous axial images were obtained from the base of the skull through the vertex without intravenous contrast. RADIATION DOSE REDUCTION: This exam was performed according to the departmental dose-optimization program which includes automated exposure control, adjustment of the mA and/or kV according to patient size and/or use of iterative reconstruction technique. COMPARISON:  Head CT 02/05/2022.  Sinuses FINDINGS: Brain: Scattered hyperdense brain lesions, very similar in appearance although in a different locations now compared to the more than half a dozen lesions identified in August. And of note, a large 2.6 cm lesion of the central of the superior cerebellum in the midline at that time seems resolved, is no longer evident. Now a left perirolandic lesion measures 16 mm on series 3, image 23. Approximately 5 such lesions are identified today ranging from 8 mm to nearly 19 mm diameter. Craniotomy and mild resection changes in the right occipital lobe area of dominant lesions previously. Despite the multiple hyperdense lesions there is only mild associated cerebral edema and no significant intracranial mass effect. No intraventricular or extra-axial hemorrhage. No changes typical of acute cortically  based infarct. Vascular: Calcified atherosclerosis at the skull base. No suspicious intracranial vascular hyperdensity. Skull: Small right posterior craniotomy since August. No other acute osseous finding. Sinuses/Orbits: Visualized paranasal sinuses and mastoids are stable and well aerated. Other: Interval postoperative changes to the right posterior scalp.  Visualized orbit soft tissues are within normal limits. IMPRESSION: 1. Again there is strong evidence of cerebral metastatic disease, but with unusual constellation of findings: Marked regression since August of numerous up to 2.6 cm brain lesions, but 5-6 new similar lesions now individually up to 1.9 cm now. Interval craniotomy on the right. And a new left motor strip lesion now is likely associated with the right upper extremity symptoms. This was discussed by telephone with Dr. Merlyn Lot on 04/29/2022 at 0904 hours. He states colon cancer metastases were confirmed at [facility], and as before the new lesions are hyperdense which could be due to mucin or blood products. 2. Relatively mild associated cerebral edema with no significant intracranial mass effect. Electronically Signed   By: Genevie Ann M.D.   On: 04/29/2022 09:13   DG Chest Portable 1 View  Result Date: 04/29/2022 CLINICAL DATA:  Mental status changes. EXAM: PORTABLE CHEST 1 VIEW COMPARISON:  None Available. FINDINGS: Top-normal heart size. Cardiac monitor overlies mid chest. Low lung volumes with bibasilar atelectasis. There is no evidence of pulmonary edema, consolidation, pneumothorax or pleural fluid. Visualized bony structures are unremarkable. IMPRESSION: Low lung volumes with bibasilar atelectasis. Electronically Signed   By: Aletta Edouard M.D.   On: 04/29/2022 08:54    Microbiology: Results for orders placed or performed during the hospital encounter of 04/29/22  MRSA Next Gen by PCR, Nasal     Status: None   Collection Time: 04/29/22  6:40 PM   Specimen: Nasal Mucosa; Nasal Swab  Result Value Ref Range Status   MRSA by PCR Next Gen NOT DETECTED NOT DETECTED Final    Comment: (NOTE) The GeneXpert MRSA Assay (FDA approved for NASAL specimens only), is one component of a comprehensive MRSA colonization surveillance program. It is not intended to diagnose MRSA infection nor to guide or monitor treatment for MRSA  infections. Test performance is not FDA approved in patients less than 1 years old. Performed at [facility], [address] [place], [place] [address]     Labs: CBC: Recent Labs  Lab 04/29/22 0810 05/01/22 0423  WBC 3.7* 6.7  NEUTROABS 2.0  --   HGB 11.6* 10.6*  HCT 35.2* 32.1*  MCV 86.7 85.6  PLT 101* 604*   Basic Metabolic Panel: Recent Labs  Lab 04/29/22 0810 05/01/22 0423  NA 136 136  K 4.6 3.8  CL 105 103  CO2 20* 23  GLUCOSE 169* 148*  BUN 10 14  CREATININE 0.60 0.53  CALCIUM 9.5 9.3   Liver Function Tests: Recent Labs  Lab 04/29/22 0810  AST 41  ALT 24  ALKPHOS 53  BILITOT 0.9  PROT 7.4  ALBUMIN 4.1   CBG: Recent Labs  Lab 04/30/22 1132 04/30/22 1722 04/30/22 2110 05/01/22 0716 05/01/22 1116  GLUCAP 208* 198* 243* 164* 133*    Discharge time spent: greater than 30 minutes.  Signed: Max Sane, MD [facility] 05/02/2022

## 2022-05-03 LAB — METHYLMALONIC ACID, SERUM: Methylmalonic Acid, Quantitative: 133 nmol/L (ref 0–378)

## 2022-07-22 ENCOUNTER — Inpatient Hospital Stay
Admission: RE | Admit: 2022-07-22 | Discharge: 2022-07-22 | Disposition: A | Discharge status: Home / Self Care | Payer: Self-pay | Source: Ambulatory Visit | Attending: *Deleted | Admitting: *Deleted

## 2022-07-22 ENCOUNTER — Other Ambulatory Visit: Payer: Self-pay | Admitting: *Deleted

## 2022-07-22 ENCOUNTER — Ambulatory Visit
Admission: RE | Admit: 2022-07-22 | Discharge: 2022-07-22 | Disposition: A | Discharge status: Home / Self Care | Payer: Medicare Other | Source: Ambulatory Visit | Attending: Pediatrics | Admitting: Pediatrics

## 2022-07-22 DIAGNOSIS — Z1231 Encounter for screening mammogram for malignant neoplasm of breast: Secondary | ICD-10-CM

## 2022-12-02 ENCOUNTER — Other Ambulatory Visit: Payer: Self-pay | Admitting: Pediatrics

## 2022-12-02 DIAGNOSIS — Z780 Asymptomatic menopausal state: Secondary | ICD-10-CM

## 2022-12-02 DIAGNOSIS — M8580 Other specified disorders of bone density and structure, unspecified site: Secondary | ICD-10-CM

## 2022-12-04 ENCOUNTER — Ambulatory Visit
Admission: RE | Admit: 2022-12-04 | Discharge: 2022-12-04 | Disposition: A | Discharge status: Home / Self Care | Payer: Medicare Other | Source: Ambulatory Visit | Attending: Pediatrics | Admitting: Pediatrics

## 2022-12-04 DIAGNOSIS — Z780 Asymptomatic menopausal state: Secondary | ICD-10-CM | POA: Diagnosis present

## 2022-12-04 DIAGNOSIS — M8580 Other specified disorders of bone density and structure, unspecified site: Secondary | ICD-10-CM | POA: Insufficient documentation

## 2023-01-20 ENCOUNTER — Other Ambulatory Visit: Payer: Self-pay

## 2023-01-20 ENCOUNTER — Encounter: Payer: Self-pay | Admitting: Emergency Medicine

## 2023-01-20 ENCOUNTER — Emergency Department: Payer: Medicare Other

## 2023-01-20 ENCOUNTER — Emergency Department
Admission: EM | Admit: 2023-01-20 | Discharge: 2023-01-20 | Disposition: A | Discharge status: Home / Self Care | Payer: Medicare Other | Attending: Emergency Medicine | Admitting: Emergency Medicine

## 2023-01-20 DIAGNOSIS — R531 Weakness: Secondary | ICD-10-CM

## 2023-01-20 DIAGNOSIS — E119 Type 2 diabetes mellitus without complications: Secondary | ICD-10-CM | POA: Diagnosis not present

## 2023-01-20 DIAGNOSIS — I10 Essential (primary) hypertension: Secondary | ICD-10-CM

## 2023-01-20 DIAGNOSIS — I2510 Atherosclerotic heart disease of native coronary artery without angina pectoris: Secondary | ICD-10-CM | POA: Diagnosis not present

## 2023-01-20 DIAGNOSIS — E871 Hypo-osmolality and hyponatremia: Secondary | ICD-10-CM | POA: Diagnosis not present

## 2023-01-20 LAB — BASIC METABOLIC PANEL
Anion gap: 13 (ref 5–15)
BUN: 15 mg/dL (ref 8–23)
CO2: 20 mmol/L — ABNORMAL LOW (ref 22–32)
Calcium: 9.3 mg/dL (ref 8.9–10.3)
Chloride: 94 mmol/L — ABNORMAL LOW (ref 98–111)
Creatinine, Ser: 0.67 mg/dL (ref 0.44–1.00)
GFR, Estimated: >60 mL/min (ref >60)
Glucose, Bld: 123 mg/dL — ABNORMAL HIGH (ref 70–99)
Potassium: 4.3 mmol/L (ref 3.5–5.1)
Sodium: 127 mmol/L — ABNORMAL LOW (ref 135–145)

## 2023-01-20 LAB — URINALYSIS, ROUTINE W REFLEX MICROSCOPIC
Bilirubin Urine: NEGATIVE
Glucose, UA: NEGATIVE mg/dL
Hgb urine dipstick: NEGATIVE
Ketones, ur: NEGATIVE mg/dL
Leukocytes,Ua: NEGATIVE
Nitrite: NEGATIVE
Protein, ur: NEGATIVE mg/dL
Specific Gravity, Urine: 1.004 — ABNORMAL LOW (ref 1.005–1.030)
pH: 6 (ref 5.0–8.0)

## 2023-01-20 LAB — CBC
HCT: 33 % — ABNORMAL LOW (ref 36.0–46.0)
Hemoglobin: 11.9 g/dL — ABNORMAL LOW (ref 12.0–15.0)
MCH: 30 pg (ref 26.0–34.0)
MCHC: 36.1 g/dL — ABNORMAL HIGH (ref 30.0–36.0)
MCV: 83.1 fL (ref 80.0–100.0)
Platelets: 179 10*3/uL (ref 150–400)
RBC: 3.97 MIL/uL (ref 3.87–5.11)
RDW: 11.9 % (ref 11.5–15.5)
WBC: 7.5 10*3/uL (ref 4.0–10.5)
nRBC: 0 % (ref 0.0–0.2)

## 2023-01-20 LAB — TROPONIN I (HIGH SENSITIVITY): Troponin I (High Sensitivity): 6 ng/L (ref <18)

## 2023-01-20 LAB — CBG MONITORING, ED: Glucose-Capillary: 126 mg/dL — ABNORMAL HIGH (ref 70–99)

## 2023-01-20 MED ORDER — SODIUM CHLORIDE 0.9 % IV BOLUS
1000.0000 mL | Freq: Once | INTRAVENOUS | Status: AC
  Administered 2023-01-20: 1000 mL via INTRAVENOUS

## 2023-01-20 NOTE — ED Notes (Signed)
Pt states she has condition where she gets bleeds easily in her brain, pt was worried with HTN that she could develop some bleeding or seizures, pt called PCP who told her to come to ER. Pt denies any neuro deficits at this time.

## 2023-01-20 NOTE — ED Triage Notes (Signed)
Pt presents to ED via [facility] from home with complaints of HTN and fatigue x 3 days worse midday. Pt has a hx of HTN and is complaint with her medications. Pt states her BP at home was 178/80. A&Ox4 at this time. Denies syncope, LOC, CP or SOB.   VS with EMS  89 HR 172/78 CBG -124

## 2023-01-20 NOTE — ED Provider Notes (Signed)
[facility] Provider Note    Event Date/Time   First MD Initiated Contact with Patient 01/20/23 2003     (approximate)   History   Weakness   HPI  Whitney [name] is a 82 y.o. female with history of hypertension, seizure disorder, T2DM, CAD presenting to the ER for evaluation of fatigue.  Patient reports that for the past several days she has had some generalized fatigue.  She does think that this may be related to her multiple blood pressure medications as she does feel worse fatigue when her blood pressure goes below a systolic of 120.  Today, she noted that she actually had elevated blood pressure up to a systolic of 190.  She called her primary care office and it was recommended that she present to the ER.  She denies headaches, focal numbness, tingling, weakness, chest pain, shortness of breath, abdominal pain.  Does report she has had some decreased p.o. intake over the past few days.     Physical Exam   Triage Vital Signs: ED Triage Vitals  Encounter Vitals Group     BP 01/20/23 1947 126/70     Systolic BP Percentile --      Diastolic BP Percentile --      Pulse Rate 01/20/23 1947 77     Resp 01/20/23 1947 18     Temp 01/20/23 1947 97.9 F (36.6 C)     Temp Source 01/20/23 1947 Oral     SpO2 01/20/23 1947 99 %     Weight 01/20/23 1944 143 lb (64.9 kg)     Height 01/20/23 1944 5' 5.5" (1.664 m)     Head Circumference --      Peak Flow --      Pain Score 01/20/23 1944 0     Pain Loc --      Pain Education --      Exclude from Growth Chart --     Most recent vital signs: Vitals:   01/20/23 2100 01/20/23 2300  BP: (!) 140/68 (!) 154/61  Pulse:  77  Resp: 17 16  Temp:    SpO2:  96%     General: Awake, interactive  CV:  Regular rate, good peripheral perfusion.  Resp:  Lungs clear, unlabored respirations.  Abd:  Soft, nondistended.  Neuro:  Alert and oriented, normal extraocular movements, symmetric facial movement, sensation intact  over bilateral upper and lower extremities with 5 out of 5 strength.  Normal finger-to-nose testing.   ED Results / Procedures / Treatments   Labs (all labs ordered are listed, but only abnormal results are displayed) Labs Reviewed  BASIC METABOLIC PANEL - Abnormal; Notable for the following components:      Result Value   Sodium 127 (*)    Chloride 94 (*)    CO2 20 (*)    Glucose, Bld 123 (*)    All other components within normal limits  CBC - Abnormal; Notable for the following components:   Hemoglobin 11.9 (*)    HCT 33.0 (*)    MCHC 36.1 (*)    All other components within normal limits  URINALYSIS, ROUTINE W REFLEX MICROSCOPIC - Abnormal; Notable for the following components:   Color, Urine COLORLESS (*)    APPearance CLEAR (*)    Specific Gravity, Urine 1.004 (*)    All other components within normal limits  CBG MONITORING, ED - Abnormal; Notable for the following components:   Glucose-Capillary 126 (*)    All  other components within normal limits  TROPONIN I (HIGH SENSITIVITY)     EKG EKG independently reviewed interpreted by myself (ER attending) demonstrates:  EKG demonstrates sinus rhythm at a rate of 90, PR 166, QRS 82, QTc 440, no acute ST changes  RADIOLOGY Imaging independently reviewed and interpreted by myself demonstrates:  CXR without focal pneumonia CT head without acute bleed  PROCEDURES:  Critical Care performed: No  Procedures   MEDICATIONS ORDERED IN ED: Medications  sodium chloride 0.9 % bolus 1,000 mL (0 mLs Intravenous Stopped 01/20/23 2307)     IMPRESSION / MDM / ASSESSMENT AND PLAN / ED COURSE  I reviewed the triage vital signs and the nursing notes.  Differential diagnosis includes, but is not limited to, hypertensive emergency including cardiac strain, pulmonary edema, intracranial bleed, hypertensive urgency, anemia, electrolyte abnormality, UTI  Patient's presentation is most consistent with acute presentation with potential  threat to life or bodily function.  82 year old female presenting with increased fatigue over the past few days and elevated blood pressure earlier today.  Blood pressure improved at the time of my evaluation.  Her lab work demonstrates some new hyponatremia with a sodium of 127, otherwise without severe derangements.  Patient reports she has been told she has low sodium before, but has not been this low on available records for me to review.  I did discuss possibly admission for further evaluation, but patient does have a primary care doctor that she can follow-up closely with for further evaluation and would prefer to be discharged.  Suspect some component of hypovolemic hyponatremia so she was given some IV fluids here.  Head CT reassuring.  Patient tolerating p.o. without issue.  Due to patient is stable for discharge home.  Strict return precautions provided.  Patient discharged in stable condition.     FINAL CLINICAL IMPRESSION(S) / ED DIAGNOSES   Final diagnoses:  Uncontrolled hypertension  Weakness  Hyponatremia     Rx / DC Orders   ED Discharge Orders     None        Note:  This document was prepared using Dragon voice recognition software and may include unintentional dictation errors.   Trinna Post, MD 01/20/23 [phone]

## 2023-01-20 NOTE — Discharge Instructions (Signed)
You were seen in the emergency department today for evaluation of your high blood pressure and weakness. Your testing showed that you do have a low sodium level.  Please arrange close follow-up with your primary care doctor for further evaluation.  Please also discuss your blood pressure medication further with them.  Return to the ER for new or worsening symptoms.

## 2023-06-07 ENCOUNTER — Other Ambulatory Visit: Payer: Self-pay | Admitting: Pediatrics

## 2023-06-07 DIAGNOSIS — E042 Nontoxic multinodular goiter: Secondary | ICD-10-CM

## 2023-08-20 ENCOUNTER — Encounter: Payer: Self-pay | Admitting: Cardiovascular Disease

## 2023-08-20 ENCOUNTER — Telehealth: Payer: Self-pay | Admitting: Emergency Medicine

## 2023-08-20 ENCOUNTER — Ambulatory Visit: Payer: Medicare Other | Attending: Cardiovascular Disease | Admitting: Cardiovascular Disease

## 2023-08-20 VITALS — BP 130/60 | HR 53 | Ht 65.5 in | Wt 149.1 lb

## 2023-08-20 DIAGNOSIS — I10 Essential (primary) hypertension: Secondary | ICD-10-CM | POA: Diagnosis present

## 2023-08-20 DIAGNOSIS — Z8673 Personal history of transient ischemic attack (TIA), and cerebral infarction without residual deficits: Secondary | ICD-10-CM | POA: Diagnosis present

## 2023-08-20 DIAGNOSIS — I25118 Atherosclerotic heart disease of native coronary artery with other forms of angina pectoris: Secondary | ICD-10-CM | POA: Diagnosis present

## 2023-08-20 MED ORDER — CARVEDILOL 6.25 MG PO TABS: 6.2500 mg | ORAL_TABLET | Freq: Two times a day (BID) | ORAL | 3 refills | Status: DC

## 2023-08-20 MED ORDER — ASPIRIN 81 MG PO TBEC
81.0000 mg | DELAYED_RELEASE_TABLET | Freq: Every day | ORAL | Status: AC
Start: 2023-08-20 — End: ?

## 2023-08-20 NOTE — Telephone Encounter (Signed)
Called Whitney [name] and notified her of the following from Dr. Mariah Milling.  Can we recommend to her she take aspirin 81 mg daily   Whitney [name] verbalizes understanding.

## 2023-08-20 NOTE — Progress Notes (Addendum)
Cardiology Office Note  Date:  08/20/2023   ID:  Whitney [name], DOB [birth year], MRN [id number]  PCP:  Atha Starks, MD   Chief Complaint  Patient presents with   New Patient (Initial Visit)    Establish care for severe HTN & hyperlipidemia with Type II diabetes mellitus. Denies chest pain or shortness of breath.     HPI:  Ms. Whitney [name] is a 83 year old woman with past medical history of Diabetes type 2 Liver cirrhosis/NASH Former smoker 14 years quit 1977 Brain surgery in 2023 x2, cavernous malformations Hx of seizures, gets lightheaded, poor balance, uses a cane Who presents by referral from Dr. Melinda Crutch for hypertension, hyperlipidemia, aortic calcification, coronary calcification  Seen by primary care May 28, 2023 Reported having some occasional substernal chest pain at night  On discussions today she denies significant shortness of breath or chest pain on exertion No regular exercise program, reports some gait instability Still independent, driving  CT scan chest April 30, 2022, images pulled up and reviewed Showing diffuse moderate aortic atherosclerosis, diffuse moderate three-vessel coronary artery calcification, heavy mitral annular calcification, cirrhosis  Sedentary in winter, Better in warmer weather  For the most part blood pressure well-controlled at home Rare spikes in pressure up to 190 systolic, this does not happen very often  Currently taking Coreg 6.25 morning, noon, 12.5 evening Takes telmisartan 20 twice daily, amlodipine 10 in the evening Reports having some orthostasis/dizzy symptoms  Heart rate low today, 53 She was concerned about being dizzy for her appointment today  Echo report done through facility in [facility] provided by the patient on today's visit Left ventricular normal size, upper normal wall thickness, ejection fraction 60 to 65% RV normal size and function Moderate mitral annular calcification present  Lab work  reviewed A1c 5.6 Total cholesterol 143 LDL 58  EKG personally reviewed by myself on todays visit EKG Interpretation Date/Time:  Friday August 20 2023 09:11:35 EST Ventricular Rate:  53 PR Interval:  174 QRS Duration:  90 QT Interval:  440 QTC Calculation: 412 R Axis:   -10  Text Interpretation: Sinus bradycardia When compared with ECG of 20-Jan-2023 19:46, Premature atrial complexes are no longer Present Vent. rate has decreased BY  37 BPM Questionable change in QRS axis Confirmed by Julien Nordmann [phone]) on 08/20/2023 9:17:08 AM    PMH:   has a past medical history of Allergy, Cancer (HCC) (2007), Diabetes mellitus without complication (HCC), Fibromuscular dysplasia (HCC), Hypertension, NASH (nonalcoholic steatohepatitis), and Stroke (HCC).  PSH:    Past Surgical History:  Procedure Laterality Date   BREAST EXCISIONAL BIOPSY Bilateral 60's, 70's, 80's   Benign    Current Outpatient Medications  Medication Sig Dispense Refill   amLODipine (NORVASC) 10 MG tablet Take 10 mg by mouth daily.     ascorbic acid (VITAMIN C) 500 MG tablet Take 500 mg by mouth every other day.     busPIRone (BUSPAR) 5 MG tablet Take 5 mg by mouth 3 (three) times daily as needed.     Cholecalciferol (VITAMIN D3) 2000 UNITS capsule Take 2,000 Units by mouth daily.     escitalopram (LEXAPRO) 10 MG tablet Take 10 mg by mouth daily.     estradiol (ESTRACE) 0.1 MG/GM vaginal cream Place 1 Applicatorful vaginally 2 (two) times a week.     ferrous sulfate ER 142 (45 Fe) MG TBCR tablet Take 140 mg by mouth every other day.     fexofenadine (ALLEGRA) 180 MG tablet Take 180 mg by mouth  daily as needed for allergies.     fluticasone (FLONASE) 50 MCG/ACT nasal spray Place 1 spray into both nostrils daily.     gabapentin (NEURONTIN) 300 MG capsule Take 300 mg by mouth 3 (three) times daily.     levETIRAcetam (KEPPRA XR) 750 MG 24 hr tablet Take 750 mg by mouth daily.     metFORMIN (GLUCOPHAGE-XR) 500 MG 24 hr  tablet Take 500-1,000 mg by mouth 2 (two) times daily. 500 mg every morning and 1000 mg every evening     metroNIDAZOLE (METROCREAM) 0.75 % cream Apply 1 Application topically 2 (two) times daily.     mometasone (ELOCON) 0.1 % cream Apply 1 Application topically daily.     Multiple Vitamins-Minerals (MULTIVITAMIN WITH MINERALS) tablet Take 1 tablet by mouth daily.     olmesartan (BENICAR) 20 MG tablet Take 20 mg by mouth 2 (two) times daily.     omeprazole (PRILOSEC) 20 MG capsule Take 20 mg by mouth daily.     pravastatin (PRAVACHOL) 10 MG tablet Take 10 mg by mouth daily.     traZODone (DESYREL) 50 MG tablet Take 50 mg by mouth at bedtime.     Blood Glucose Monitoring Suppl (FIFTY50 GLUCOSE METER 2.0) w/Device KIT Frequency:PHARMDIR   Dosage:0.0     Instructions:  Note:microfill contour strips BID Dose: 1 (Patient not taking: Reported on 08/20/2023)     carvedilol (COREG) 6.25 MG tablet Take 1 tablet (6.25 mg total) by mouth 2 (two) times daily with a meal. 180 tablet 3   clonazePAM (KLONOPIN) 0.5 MG tablet Take 0.5 mg by mouth 2 (two) times daily as needed for anxiety. (Patient not taking: Reported on 08/20/2023)     methocarbamol (ROBAXIN) 500 MG tablet TAKE 1 TABLET BY MOUTH FOUR TIMES A DAY AS NEEDED     No current facility-administered medications for this visit.     Allergies:   Epinephrine, Gluten meal, Hydrocodone, Lactose, Nsaids, Oxycodone, Propoxyphene, Penicillins, Sulfa antibiotics, Chlorthalidone, Codeine, Ibuprofen, Iohexol, Propoxyphene n-acetaminophen, Tramadol, Dexamethasone, Hydrocodone-acetaminophen, Iodinated contrast media, Nitrofurantoin, and Other   Social History:  The patient  reports that she quit smoking about 48 years ago. Her smoking use included cigarettes. She has never used smokeless tobacco. She reports current alcohol use. She reports that she does not use drugs.   Family History:   family history includes Heart attack (age of onset: 66) in her father;  Hyperlipidemia in her father; Hypertension in her father.    Review of Systems: Review of Systems  Constitutional: Negative.   HENT: Negative.    Respiratory: Negative.    Cardiovascular: Negative.   Gastrointestinal: Negative.   Musculoskeletal: Negative.   Neurological: Negative.   Psychiatric/Behavioral: Negative.    All other systems reviewed and are negative.   PHYSICAL EXAM: VS:  BP 130/60 (BP Location: Right Arm, Patient Position: Sitting, Cuff Size: Normal)   Pulse (!) 53   Ht 5' 5.5" (1.664 m)   Wt 149 lb 2 oz (67.6 kg)   SpO2 97%   BMI 24.44 kg/m  , BMI Body mass index is 24.44 kg/m. GEN: Well nourished, well developed, in no acute distress HEENT: normal Neck: no JVD, carotid bruits, or masses Cardiac: RRR; no murmurs, rubs, or gallops,no edema  Respiratory:  clear to auscultation bilaterally, normal work of breathing GI: soft, nontender, nondistended, + BS MS: no deformity or atrophy Skin: warm and dry, no rash Neuro:  Strength and sensation are intact Psych: euthymic mood, full affect   Recent Labs: 01/20/2023:  BUN 15; Creatinine, Ser 0.67; Hemoglobin 11.9; Platelets 179; Potassium 4.3; Sodium 127    Lipid Panel Lab Results  Component Value Date   CHOL 149 04/30/2022   HDL 51 04/30/2022   LDLCALC 82 04/30/2022   TRIG 78 04/30/2022      Wt Readings from Last 3 Encounters:  08/20/23 149 lb 2 oz (67.6 kg)  01/20/23 143 lb (64.9 kg)  04/29/22 148 lb 9.4 oz (67.4 kg)      ASSESSMENT AND PLAN:  Problem List Items Addressed This Visit       Cardiology Problems   HTN (hypertension)   Relevant Medications   amLODipine (NORVASC) 10 MG tablet   olmesartan (BENICAR) 20 MG tablet   carvedilol (COREG) 6.25 MG tablet   Other Relevant Orders   EKG 12-Lead (Completed)   Coronary artery disease of native artery of native heart with stable angina pectoris (HCC) - Primary   Relevant Medications   amLODipine (NORVASC) 10 MG tablet   olmesartan (BENICAR)  20 MG tablet   levETIRAcetam (KEPPRA XR) 750 MG 24 hr tablet   carvedilol (COREG) 6.25 MG tablet   Other Relevant Orders   EKG 12-Lead (Completed)     Other   History of CVA (cerebrovascular accident)   Relevant Orders   EKG 12-Lead (Completed)   Coronary calcification Noted on CT scan from October 2023, images pulled up and reviewed and discussed with her -Cholesterol is at goal, she denies angina or shortness of breath concerning for angina -We did discuss stress testing for any anginal symptoms, she will call us for the symptoms and further testing can be arranged but she would like to hold off for now  Aortic atherosclerosis Moderate diffuse disease noted on CT imaging Diabetes numbers at goal, cholesterol close to goal Goal LDL less than 55, goal total cholesterol less than 140 Will recommend aspirin 81 mg daily especially in light of prior stroke  Diabetes type 2 A1c well-controlled, managed by primary care  Hyperlipidemia Currently taking pravastatin 10 mg daily Could consider adding Zetia 10 mg daily for LDL greater than 55  Essential hypertension Reports having some dizziness/orthostasis symptoms Bradycardia on EKG today rate 53 bpm We recommend she decrease Coreg down to 6.25 twice daily Continue olmesartan 20 twice daily For continued lightheadedness, could consider changing amlodipine to 5 twice daily from 10 in the evening Recommend she stay hydrated    Signed, Dossie Arbour, M.D., Ph.D. [facility] [place], [place] [phone]

## 2023-08-20 NOTE — Patient Instructions (Addendum)
Medication Instructions:  Please decrease the coreg down to 6.25 mg twice a day 12 hours apart  For spike in blood pressure,  Take regular medications first Ok to take extra amlodipine 5 mg as needed  If you continue to have lightheadedness, Try amlodipine 5 mg twice a day (rather than 10 mg in the evening)  If you need a refill on your cardiac medications before your next appointment, please call your pharmacy.   Lab work: No new labs needed  Testing/Procedures: No new testing needed  Follow-Up: At [facility], you and your health needs are our priority.  As part of our continuing mission to provide you with exceptional heart care, we have created designated Provider Care Teams.  These Care Teams include your primary Cardiologist (physician) and Advanced Practice Providers (APPs -  Physician Assistants and Nurse Practitioners) who all work together to provide you with the care you need, when you need it.  You will need a follow up appointment in 6 months  Providers on your designated Care Team:   Nicolasa Ducking, NP Eula Listen, PA-C Cadence Fransico [place], [place]  COVID-19 Vaccine Information can be found at: [email or web address] For questions related to vaccine distribution or appointments, please email [email or web address] or call [phone].

## 2023-08-23 ENCOUNTER — Other Ambulatory Visit: Payer: Self-pay | Admitting: Pediatrics

## 2023-08-23 DIAGNOSIS — Z1231 Encounter for screening mammogram for malignant neoplasm of breast: Secondary | ICD-10-CM

## 2023-09-14 ENCOUNTER — Ambulatory Visit
Admission: RE | Admit: 2023-09-14 | Discharge: 2023-09-14 | Disposition: A | Discharge status: Home / Self Care | Payer: Medicare Other | Source: Ambulatory Visit | Attending: Pediatrics | Admitting: Pediatrics

## 2023-09-14 DIAGNOSIS — Z1231 Encounter for screening mammogram for malignant neoplasm of breast: Secondary | ICD-10-CM | POA: Insufficient documentation

## 2023-10-28 ENCOUNTER — Emergency Department

## 2023-10-28 ENCOUNTER — Emergency Department
Admission: EM | Admit: 2023-10-28 | Discharge: 2023-10-28 | Disposition: A | Discharge status: Home / Self Care | Attending: Emergency Medicine | Admitting: Emergency Medicine

## 2023-10-28 ENCOUNTER — Other Ambulatory Visit: Payer: Self-pay

## 2023-10-28 DIAGNOSIS — W19XXXA Unspecified fall, initial encounter: Secondary | ICD-10-CM

## 2023-10-28 DIAGNOSIS — S060X1A Concussion with loss of consciousness of 30 minutes or less, initial encounter: Secondary | ICD-10-CM | POA: Diagnosis not present

## 2023-10-28 DIAGNOSIS — I10 Essential (primary) hypertension: Secondary | ICD-10-CM | POA: Insufficient documentation

## 2023-10-28 DIAGNOSIS — S0990XA Unspecified injury of head, initial encounter: Secondary | ICD-10-CM | POA: Diagnosis present

## 2023-10-28 DIAGNOSIS — S0003XA Contusion of scalp, initial encounter: Secondary | ICD-10-CM

## 2023-10-28 DIAGNOSIS — W01198A Fall on same level from slipping, tripping and stumbling with subsequent striking against other object, initial encounter: Secondary | ICD-10-CM | POA: Diagnosis not present

## 2023-10-28 DIAGNOSIS — Y92481 Parking lot as the place of occurrence of the external cause: Secondary | ICD-10-CM | POA: Insufficient documentation

## 2023-10-28 DIAGNOSIS — E119 Type 2 diabetes mellitus without complications: Secondary | ICD-10-CM | POA: Insufficient documentation

## 2023-10-28 LAB — CBC
HCT: 36.1 % (ref 36.0–46.0)
Hemoglobin: 13.1 g/dL (ref 12.0–15.0)
MCH: 29.9 pg (ref 26.0–34.0)
MCHC: 36.3 g/dL — ABNORMAL HIGH (ref 30.0–36.0)
MCV: 82.4 fL (ref 80.0–100.0)
Platelets: 156 10*3/uL (ref 150–400)
RBC: 4.38 MIL/uL (ref 3.87–5.11)
RDW: 12.4 % (ref 11.5–15.5)
WBC: 6.8 10*3/uL (ref 4.0–10.5)
nRBC: 0 % (ref 0.0–0.2)

## 2023-10-28 LAB — COMPREHENSIVE METABOLIC PANEL WITH GFR
ALT: 32 U/L (ref 0–44)
AST: 36 U/L (ref 15–41)
Albumin: 4.6 g/dL (ref 3.5–5.0)
Alkaline Phosphatase: 49 U/L (ref 38–126)
Anion gap: 8 (ref 5–15)
BUN: 22 mg/dL (ref 8–23)
CO2: 20 mmol/L — ABNORMAL LOW (ref 22–32)
Calcium: 9.5 mg/dL (ref 8.9–10.3)
Chloride: 93 mmol/L — ABNORMAL LOW (ref 98–111)
Creatinine, Ser: 0.8 mg/dL (ref 0.44–1.00)
GFR, Estimated: >60 mL/min (ref >60)
Glucose, Bld: 117 mg/dL — ABNORMAL HIGH (ref 70–99)
Potassium: 5.3 mmol/L — ABNORMAL HIGH (ref 3.5–5.1)
Sodium: 121 mmol/L — ABNORMAL LOW (ref 135–145)
Total Bilirubin: 0.8 mg/dL (ref 0.0–1.2)
Total Protein: 7.7 g/dL (ref 6.5–8.1)

## 2023-10-28 LAB — CBG MONITORING, ED: Glucose-Capillary: 123 mg/dL — ABNORMAL HIGH (ref 70–99)

## 2023-10-28 MED ORDER — SODIUM CHLORIDE 1 G PO TABS: 1.0000 g | ORAL_TABLET | Freq: Two times a day (BID) | ORAL | 0 refills | Status: AC

## 2023-10-28 MED ORDER — ONDANSETRON 8 MG PO TBDP: 8.0000 mg | ORAL_TABLET | Freq: Three times a day (TID) | ORAL | 0 refills | Status: AC | PRN

## 2023-10-28 MED ORDER — ACETAMINOPHEN 500 MG PO TABS
1000.0000 mg | ORAL_TABLET | Freq: Once | ORAL | Status: AC
  Administered 2023-10-28: 1000 mg via ORAL
  Filled 2023-10-28: qty 2

## 2023-10-28 MED ORDER — ONDANSETRON HCL 4 MG/2ML IJ SOLN
4.0000 mg | Freq: Once | INTRAMUSCULAR | Status: AC
  Administered 2023-10-28: 4 mg via INTRAVENOUS
  Filled 2023-10-28: qty 2

## 2023-10-28 MED ORDER — SODIUM CHLORIDE 0.9 % IV BOLUS
1000.0000 mL | Freq: Once | INTRAVENOUS | Status: AC
  Administered 2023-10-28: 1000 mL via INTRAVENOUS

## 2023-10-28 NOTE — ED Provider Notes (Signed)
 [facility] Provider Note   Event Date/Time   First MD Initiated Contact with Patient 10/28/23 1132     (approximate) History  Fall  HPI Whitney [name] is a 83 y.o. female with a stated past medical history of cavernoma status post 1 resection, bulbar vestibulitis, type 2 diabetes, hypertension, hypercholesterolemia, and previous CVA who presents complaining of a fall from standing.  Patient states that she had a pain to her right leg that required her to hold onto a car and shopping cart.  Patient states that the shopping cart rolled away causing her to fall backwards striking the back of her head on pavement.  Patient denies any loss of consciousness but states that she has been having mild nausea and difficulty with memory since the event.  Patient denies any vomiting ROS: Patient currently denies any vision changes, tinnitus, difficulty speaking, facial droop, sore throat, chest pain, shortness of breath, abdominal pain, nausea/vomiting/diarrhea, dysuria, or weakness/numbness/paresthesias in any extremity   Physical Exam  Triage Vital Signs: ED Triage Vitals  Encounter Vitals Group     BP 10/28/23 1136 (!) 144/65     Systolic BP Percentile --      Diastolic BP Percentile --      Pulse Rate 10/28/23 1136 (!) 53     Resp 10/28/23 1136 18     Temp 10/28/23 1136 98.4 F (36.9 C)     Temp Source 10/28/23 1136 Oral     SpO2 10/28/23 1136 100 %     Weight 10/28/23 1134 158 lb (71.7 kg)     Height --      Head Circumference --      Peak Flow --      Pain Score 10/28/23 1134 0     Pain Loc --      Pain Education --      Exclude from Growth Chart --    Most recent vital signs: Vitals:   10/28/23 1136  BP: (!) 144/65  Pulse: (!) 53  Resp: 18  Temp: 98.4 F (36.9 C)  SpO2: 100%   General: Awake, oriented x4. CV:  Good peripheral perfusion.  Resp:  Normal effort.  Abd:  No distention.  Other:  Elderly well-developed, well-nourished Caucasian female  resting comfortably in no acute distress.  Palpable deformity to the posterior right parietal scalp and small abrasion to left posterior occipital scalp ED Results / Procedures / Treatments  Labs (all labs ordered are listed, but only abnormal results are displayed) Labs Reviewed  CBC - Abnormal; Notable for the following components:      Result Value   MCHC 36.3 (*)    All other components within normal limits  COMPREHENSIVE METABOLIC PANEL WITH GFR - Abnormal; Notable for the following components:   Sodium 121 (*)    Potassium 5.3 (*)    Chloride 93 (*)    CO2 20 (*)    Glucose, Bld 117 (*)    All other components within normal limits  CBG MONITORING, ED - Abnormal; Notable for the following components:   Glucose-Capillary 123 (*)    All other components within normal limits   EKG ED ECG REPORT I, Charleen Conn, the attending physician, personally viewed and interpreted this ECG. Date: 10/28/2023 EKG Time: 1140 Rate: 50 Rhythm: Bradycardic sinus rhythm QRS Axis: normal Intervals: normal ST/T Wave abnormalities: normal Narrative Interpretation: Bradycardic sinus rhythm.  No evidence of acute ischemia RADIOLOGY ED MD interpretation: CT of the head without contrast  interpreted by me shows no evidence of acute abnormalities including no intracerebral hemorrhage, obvious masses, or significant edema.  There are incidental findings including a new hypoattenuating lesion in the left occipital lobe CT of the cervical spine interpreted by me does not show any evidence of acute abnormalities including no acute fracture, malalignment, height loss, or dislocation -Agree with radiology assessment Official radiology report(s): CT Head Wo Contrast Result Date: 10/28/2023 CLINICAL DATA:  Trauma, fall in parking lot, hit head. Abrasion to posterior aspect of head. EXAM: CT HEAD WITHOUT CONTRAST CT CERVICAL SPINE WITHOUT CONTRAST TECHNIQUE: Multidetector CT imaging of the head and cervical  spine was performed following the standard protocol without intravenous contrast. Multiplanar CT image reconstructions of the cervical spine were also generated. RADIATION DOSE REDUCTION: This exam was performed according to the departmental dose-optimization program which includes automated exposure control, adjustment of the mA and/or kV according to patient size and/or use of iterative reconstruction technique. COMPARISON:  CT head 01/20/2023. FINDINGS: CT HEAD FINDINGS Brain: No acute intracranial hemorrhage. No CT evidence of completed large territory infarct. Remote lacunar infarcts in the bilateral basal ganglia. Similar encephalomalacia in the right parietal lobe. Postsurgical changes of the right occipital calvarium with additional encephalomalacia in the right occipital lobe. There is a peripherally hyperattenuating lesion involving the left occipital lobe measuring up to 1.4 cm which is new since the prior CT. Additional dense foci in noted in the left centrum semiovale and right corona radiata which may reflect treated lesions. The basilar cisterns are patent. Ventricles: Prominence of the ventricles suggesting underlying parenchymal volume loss. Vascular: Atherosclerotic calcifications of the carotid siphons and intracranial vertebral arteries. No hyperdense vessel. Skull: No acute or aggressive finding. Postsurgical changes of right parietal and right occipital craniotomies. Orbits: Orbits are symmetric. Sinuses: Small air-fluid level in the left maxillary sinus. Other: Mastoid air cells are clear. Soft tissue swelling in the parotid occipital scalp slightly left of midline. CT CERVICAL SPINE FINDINGS Alignment: Straightening of the normal cervical lordosis. Trace anterolisthesis of C3 on C4. No facet subluxation or dislocation. Skull base and vertebrae: No displaced fracture or compression fracture in the cervical spine. No suspicious osseous lesion. Small ossification along the posterior aspect of  the dens is unchanged and may be degenerative in etiology. Calcified disc bulge at T1-2 again noted. Soft tissues and spinal canal: No prevertebral fluid or swelling. No visible canal hematoma. Redemonstrated heterogeneous appearance of the thyroid . Thyroid  nodules measuring up to 2.5 cm similar to prior. Disc levels: Mild disc space narrowing at C5-6. No high-grade osseous spinal canal stenosis. Facet arthrosis at multiple levels. No high-grade osseous foraminal stenosis. Upper chest: Negative. Other: None IMPRESSION: No acute intracranial hemorrhage. No acute fracture or traumatic malalignment of the cervical spine. Soft tissue swelling in the parieto-occipital scalp. New 1.4 cm lesion in the left occipital lobe concerning for metastasis. Additional smaller hyperintense lesions in the left centrum semiovale and right corona radiata. Consider correlation with contrast enhanced MRI. Multiple thyroid  nodules measuring up to 2.5 cm. Consider correlation with nonemergent thyroid  ultrasound. Electronically Signed   By: Denny Flack M.D.   On: 10/28/2023 13:16   CT Cervical Spine Wo Contrast Result Date: 10/28/2023 CLINICAL DATA:  Trauma, fall in parking lot, hit head. Abrasion to posterior aspect of head. EXAM: CT HEAD WITHOUT CONTRAST CT CERVICAL SPINE WITHOUT CONTRAST TECHNIQUE: Multidetector CT imaging of the head and cervical spine was performed following the standard protocol without intravenous contrast. Multiplanar CT image reconstructions of the cervical spine  were also generated. RADIATION DOSE REDUCTION: This exam was performed according to the departmental dose-optimization program which includes automated exposure control, adjustment of the mA and/or kV according to patient size and/or use of iterative reconstruction technique. COMPARISON:  CT head 01/20/2023. FINDINGS: CT HEAD FINDINGS Brain: No acute intracranial hemorrhage. No CT evidence of completed large territory infarct. Remote lacunar infarcts  in the bilateral basal ganglia. Similar encephalomalacia in the right parietal lobe. Postsurgical changes of the right occipital calvarium with additional encephalomalacia in the right occipital lobe. There is a peripherally hyperattenuating lesion involving the left occipital lobe measuring up to 1.4 cm which is new since the prior CT. Additional dense foci in noted in the left centrum semiovale and right corona radiata which may reflect treated lesions. The basilar cisterns are patent. Ventricles: Prominence of the ventricles suggesting underlying parenchymal volume loss. Vascular: Atherosclerotic calcifications of the carotid siphons and intracranial vertebral arteries. No hyperdense vessel. Skull: No acute or aggressive finding. Postsurgical changes of right parietal and right occipital craniotomies. Orbits: Orbits are symmetric. Sinuses: Small air-fluid level in the left maxillary sinus. Other: Mastoid air cells are clear. Soft tissue swelling in the parotid occipital scalp slightly left of midline. CT CERVICAL SPINE FINDINGS Alignment: Straightening of the normal cervical lordosis. Trace anterolisthesis of C3 on C4. No facet subluxation or dislocation. Skull base and vertebrae: No displaced fracture or compression fracture in the cervical spine. No suspicious osseous lesion. Small ossification along the posterior aspect of the dens is unchanged and may be degenerative in etiology. Calcified disc bulge at T1-2 again noted. Soft tissues and spinal canal: No prevertebral fluid or swelling. No visible canal hematoma. Redemonstrated heterogeneous appearance of the thyroid . Thyroid  nodules measuring up to 2.5 cm similar to prior. Disc levels: Mild disc space narrowing at C5-6. No high-grade osseous spinal canal stenosis. Facet arthrosis at multiple levels. No high-grade osseous foraminal stenosis. Upper chest: Negative. Other: None IMPRESSION: No acute intracranial hemorrhage. No acute fracture or traumatic  malalignment of the cervical spine. Soft tissue swelling in the parieto-occipital scalp. New 1.4 cm lesion in the left occipital lobe concerning for metastasis. Additional smaller hyperintense lesions in the left centrum semiovale and right corona radiata. Consider correlation with contrast enhanced MRI. Multiple thyroid  nodules measuring up to 2.5 cm. Consider correlation with nonemergent thyroid  ultrasound. Electronically Signed   By: Denny Flack M.D.   On: 10/28/2023 13:16   PROCEDURES: Critical Care performed: No Procedures MEDICATIONS ORDERED IN ED: Medications  ondansetron  (ZOFRAN ) injection 4 mg (has no administration in time range)  acetaminophen  (TYLENOL ) tablet 1,000 mg (has no administration in time range)  sodium chloride  0.9 % bolus 1,000 mL (1,000 mLs Intravenous New Bag/Given 10/28/23 1245)   IMPRESSION / MDM / ASSESSMENT AND PLAN / ED COURSE  I reviewed the triage vital signs and the nursing notes.                             The patient is on the cardiac monitor to evaluate for evidence of arrhythmia and/or significant heart rate changes. Patient's presentation is most consistent with acute presentation with potential threat to life or bodily function. Presenting after a fall that occurred just prior to arrival, resulting in injury to the left occipital scalp. The mechanism of injury was a mechanical ground level fall without syncope or near-syncope. The current level of pain is moderate. There was no loss of consciousness, confusion, seizure, or memory impairment. There  is not a laceration associated with the injury. Denies neck pain. The patient does not take blood thinner medications. Denies vomiting, numbness/weakness, fever Patient has downtrending sodium from 127-121 here today.  Patient feels comfortable going home and therefore we will start patient on oral NaCl replacement with plan to follow-up with their primary care physician in the next 5-7 days for recheck of  her sodium. Dispo: Discharge with PCP follow-up     FINAL CLINICAL IMPRESSION(S) / ED DIAGNOSES   Final diagnoses:  Fall, initial encounter  Contusion of scalp, initial encounter  Concussion with loss of consciousness of 30 minutes or less, initial encounter   Rx / DC Orders   ED Discharge Orders          Ordered    ondansetron  (ZOFRAN -ODT) 8 MG disintegrating tablet  Every 8 hours PRN        10/28/23 1325    sodium chloride  1 g tablet  2 times daily with meals        10/28/23 1331           Note:  This document was prepared using Dragon voice recognition software and may include unintentional dictation errors.   Cyndal Kasson K, MD 10/28/23 [phone]

## 2023-10-28 NOTE — ED Triage Notes (Signed)
 Pt arrived via EMS from [facility] [employer]. Pt fell in the parking lot. Per EMS pt has vertigo and was holding the car door and the cart at the same time and fell over. Pt has an abrasion to the posterior side of her head. Pt has indention's on the posterior side of her head from a previous surgery. Pt is A/Ox4.

## 2023-11-30 ENCOUNTER — Ambulatory Visit

## 2023-12-03 ENCOUNTER — Ambulatory Visit
Admission: RE | Admit: 2023-12-03 | Discharge: 2023-12-03 | Disposition: A | Discharge status: Home / Self Care | Source: Ambulatory Visit | Attending: Pediatrics | Admitting: Pediatrics

## 2023-12-03 DIAGNOSIS — E042 Nontoxic multinodular goiter: Secondary | ICD-10-CM | POA: Diagnosis present

## 2023-12-09 ENCOUNTER — Other Ambulatory Visit: Payer: Self-pay | Admitting: Neurosurgery

## 2023-12-09 DIAGNOSIS — G939 Disorder of brain, unspecified: Secondary | ICD-10-CM

## 2023-12-09 DIAGNOSIS — C7931 Secondary malignant neoplasm of brain: Secondary | ICD-10-CM

## 2023-12-09 DIAGNOSIS — C801 Malignant (primary) neoplasm, unspecified: Secondary | ICD-10-CM

## 2023-12-15 ENCOUNTER — Ambulatory Visit
Admission: RE | Admit: 2023-12-15 | Discharge: 2023-12-15 | Disposition: A | Discharge status: Home / Self Care | Source: Ambulatory Visit | Attending: Neurosurgery | Admitting: Neurosurgery

## 2023-12-15 DIAGNOSIS — C7931 Secondary malignant neoplasm of brain: Secondary | ICD-10-CM

## 2023-12-15 DIAGNOSIS — C801 Malignant (primary) neoplasm, unspecified: Secondary | ICD-10-CM

## 2023-12-15 DIAGNOSIS — G939 Disorder of brain, unspecified: Secondary | ICD-10-CM

## 2023-12-17 ENCOUNTER — Ambulatory Visit
Admission: RE | Admit: 2023-12-17 | Discharge: 2023-12-17 | Disposition: A | Discharge status: Home / Self Care | Source: Ambulatory Visit | Attending: Neurosurgery

## 2023-12-17 DIAGNOSIS — G939 Disorder of brain, unspecified: Secondary | ICD-10-CM | POA: Insufficient documentation

## 2023-12-17 DIAGNOSIS — C7931 Secondary malignant neoplasm of brain: Secondary | ICD-10-CM | POA: Insufficient documentation

## 2023-12-17 DIAGNOSIS — C801 Malignant (primary) neoplasm, unspecified: Secondary | ICD-10-CM | POA: Insufficient documentation

## 2023-12-17 LAB — POCT I-STAT CREATININE: Creatinine, Ser: 0.7 mg/dL (ref 0.44–1.00)

## 2023-12-17 MED ORDER — IOHEXOL 350 MG/ML SOLN: 80.0000 mL | Freq: Once | INTRAVENOUS | Status: AC | PRN

## 2024-02-02 ENCOUNTER — Ambulatory Visit (INDEPENDENT_AMBULATORY_CARE_PROVIDER_SITE_OTHER): Admitting: Podiatry

## 2024-02-02 DIAGNOSIS — M79676 Pain in unspecified toe(s): Secondary | ICD-10-CM

## 2024-02-02 DIAGNOSIS — B351 Tinea unguium: Secondary | ICD-10-CM

## 2024-02-02 NOTE — Progress Notes (Signed)
 Subjective:  Patient ID: Whitney [name], female    DOB: 06-12-1941,  MRN: [id number] HPI Chief Complaint  Patient presents with   Ingrown Toenail    Ingrown toe nail on great toe    83 y.o. female presents with the above complaint.   ROS: Denies fever chills nausea vomiting muscle aches pains calf pain back pain chest pain shortness of breath.  Past Medical History:  Diagnosis Date   Allergy    Cancer [facility]) 2007   Colon Cancer stage 1  surgery only.  no chemo or xrt.   Diabetes mellitus without complication (HCC)    Fibromuscular dysplasia (HCC)    Hypertension    NASH (nonalcoholic steatohepatitis)    Stroke [facility])    Past Surgical History:  Procedure Laterality Date   BREAST EXCISIONAL BIOPSY Bilateral 60's, 70's, 80's   Benign    Current Outpatient Medications:    amLODipine  (NORVASC ) 10 MG tablet, Take 10 mg by mouth daily., Disp: , Rfl:    ascorbic acid (VITAMIN C) 500 MG tablet, Take 500 mg by mouth every other day., Disp: , Rfl:    aspirin  EC 81 MG tablet, Take 1 tablet (81 mg total) by mouth daily. Swallow whole., Disp: , Rfl:    Blood Glucose Monitoring Suppl (FIFTY50 GLUCOSE METER 2.0) w/Device KIT, Frequency:PHARMDIR   Dosage:0.0     Instructions:  Note:microfill contour strips BID Dose: 1 (Patient not taking: Reported on 08/20/2023), Disp: , Rfl:    busPIRone  (BUSPAR ) 5 MG tablet, Take 5 mg by mouth 3 (three) times daily as needed., Disp: , Rfl:    carvedilol  (COREG ) 6.25 MG tablet, Take 1 tablet (6.25 mg total) by mouth 2 (two) times daily with a meal., Disp: 180 tablet, Rfl: 3   Cholecalciferol (VITAMIN D3) 2000 UNITS capsule, Take 2,000 Units by mouth daily., Disp: , Rfl:    clonazePAM (KLONOPIN) 0.5 MG tablet, Take 0.5 mg by mouth 2 (two) times daily as needed for anxiety. (Patient not taking: Reported on 08/20/2023), Disp: , Rfl:    escitalopram  (LEXAPRO ) 10 MG tablet, Take 10 mg by mouth daily., Disp: , Rfl:    estradiol (ESTRACE) 0.1 MG/GM vaginal cream,  Place 1 Applicatorful vaginally 2 (two) times a week., Disp: , Rfl:    ferrous sulfate  ER 142 (45 Fe) MG TBCR tablet, Take 140 mg by mouth every other day., Disp: , Rfl:    fexofenadine (ALLEGRA) 180 MG tablet, Take 180 mg by mouth daily as needed for allergies., Disp: , Rfl:    fluticasone (FLONASE) 50 MCG/ACT nasal spray, Place 1 spray into both nostrils daily., Disp: , Rfl:    gabapentin  (NEURONTIN ) 300 MG capsule, Take 300 mg by mouth 3 (three) times daily., Disp: , Rfl:    levETIRAcetam  (KEPPRA  XR) 750 MG 24 hr tablet, Take 750 mg by mouth daily., Disp: , Rfl:    metFORMIN (GLUCOPHAGE-XR) 500 MG 24 hr tablet, Take 500-1,000 mg by mouth 2 (two) times daily. 500 mg every morning and 1000 mg every evening, Disp: , Rfl:    methocarbamol (ROBAXIN) 500 MG tablet, TAKE 1 TABLET BY MOUTH FOUR TIMES A DAY AS NEEDED, Disp: , Rfl:    metroNIDAZOLE (METROCREAM) 0.75 % cream, Apply 1 Application topically 2 (two) times daily., Disp: , Rfl:    mometasone (ELOCON) 0.1 % cream, Apply 1 Application topically daily., Disp: , Rfl:    Multiple Vitamins-Minerals (MULTIVITAMIN WITH MINERALS) tablet, Take 1 tablet by mouth daily., Disp: , Rfl:    olmesartan (  BENICAR) 20 MG tablet, Take 20 mg by mouth 2 (two) times daily., Disp: , Rfl:    omeprazole (PRILOSEC) 20 MG capsule, Take 20 mg by mouth daily., Disp: , Rfl:    ondansetron  (ZOFRAN -ODT) 8 MG disintegrating tablet, Take 1 tablet (8 mg total) by mouth every 8 (eight) hours as needed for nausea or vomiting., Disp: 20 tablet, Rfl: 0   pravastatin  (PRAVACHOL ) 10 MG tablet, Take 10 mg by mouth daily., Disp: , Rfl:    sodium chloride  1 g tablet, Take 1 tablet (1 g total) by mouth 2 (two) times daily with a meal., Disp: 30 tablet, Rfl: 0   traZODone  (DESYREL ) 50 MG tablet, Take 50 mg by mouth at bedtime., Disp: , Rfl:   Allergies  Allergen Reactions   Epinephrine Hypertension and Other (See Comments)    REACTION: causes BP to elevate  Other Reaction: CNS Disorder,  Blood Pressure increased  Other reaction(s): Other (see comments)  REACTION: causes BP to elevate  Other Reaction: CNS Disorder, Blood Pressure increased  REACTION: causes BP to elevate  Other Reaction: CNS Disorder, Blood Pressure increased  REACTION: causes BP to elevate  Other Reaction: CNS Disorder, Blood Pressure increased  REACTION: causes BP to elevate   Gluten Meal Diarrhea and Other (See Comments)    Sensitivity to Gluten  Other reaction(s): Other (See Comments)  Sensitivity to Gluten  Sensitivity to Gluten  Sensitivity to Gluten  Sensitivity to Gluten  Wheat intolerance   Wheat intolerance   Hydrocodone Rash and Itching    Pt states she can take   Lactose Other (See Comments)    Lactose Intolerance   Lactose intolerance diarrhea.  GI distress  Other reaction(s): GI intolerance  Lactose Intolerance   Lactose intolerance diarrhea.  Lactose Intolerance   Lactose intolerance diarrhea.   Nsaids Hypertension and Other (See Comments)   Oxycodone Itching and Rash    Pt states she can take   Propoxyphene Other (See Comments)    REACTION: caused BP to elevate  Other Reaction: CNS Disorder  REACTION: caused BP to elevate Other Reaction: CNS Disorder REACTION: caused BP to elevate REACTION: caused BP to elevate Other Reaction: CNS Disorder  REACTION: caused BP to elevate Other Reaction: CNS Disorder REACTION: caused BP to elevate REACTION: caused BP to elevate Other Reaction: CNS Disorder    REACTION: caused BP to elevate Other Reaction: CNS Disorder REACTION: caused BP to elevate REACTION: caused BP to elevate Other Reaction: CNS Disorder   Penicillins Hives, Nausea Only and Rash    REACTION: rash  REACTION: rash  REACTION: rash  REACTION: rash  REACTION: rash  REACTION: rash  REACTION: rash   Sulfa Antibiotics Rash   Chlorthalidone Other (See Comments)    Hyponatremia Hyponatremia Other reaction(s): Other (see comments) Hyponatremia Hyponatremia More  of a intolerance per pt  Hyponatremia Hyponatremia More of a intolerance per pt  Hyponatremia Hyponatremia    Codeine Itching   Ibuprofen Other (See Comments)    REACTION: causes BP to elevate  Other reaction(s): Other (see comments)  REACTION: causes BP to elevate  REACTION: causes BP to elevate  REACTION: causes BP to elevate  REACTION: causes BP to elevate  REACTION: causes BP to elevate  Pt states 05/14/22 she can take   Iohexol  Other (See Comments)    Code: RASH, Desc: IV CONTRAST REACTION 29YRS AGO, [place] TODAY W/ 25MG  BENADRYL  PO JUST PRIOR TO EXAM/A.C., Onset Date: 91778022  Code: RASH, Desc: IV CONTRAST REACTION 29YRS AGO, [place] TODAY  W/ 25MG  BENADRYL  PO JUST PRIOR TO EXAM/A.C., Onset Date: 91778022    Code: RASH, Desc: IV CONTRAST REACTION 74YRS AGO, [place] TODAY W/ 25MG  BENADRYL  PO JUST PRIOR TO EXAM/A.C., Onset Date: 91778022  Code: RASH, Desc: IV CONTRAST REACTION 74YRS AGO, [place] TODAY W/ 25MG  BENADRYL  PO JUST PRIOR TO EXAM/A.C., Onset Date: 91778022  Code: RASH, Desc: IV CONTRAST REACTION 74YRS AGO, [place] TODAY W/ 25MG  BENADRYL  PO JUST PRIOR TO EXAM/A.C., Onset Date: 91778022   Code: RASH, Desc: IV CONTRAST REACTION 74YRS AGO, [place] TODAY W/ 25MG  BENADRYL  PO JUST PRIOR TO EXAM/A.C., Onset Date: 91778022   Propoxyphene N-Acetaminophen      REACTION: caused BP to elevate   Tramadol     Other reaction(s): Insomnia   Dexamethasone  Rash   Hydrocodone-Acetaminophen  Rash    Pt states she can take   Iodinated Contrast Media Rash    Pt is allergic to iodinated contrast media. Pt had a contrast reaction.   Nitrofurantoin Nausea Only    Nausea/diarrhea  Nausea/diarrhea Nausea/diarrhea Nausea/diarrhea Nausea/diarrhea    Nausea/diarrhea   Other Rash   Review of Systems Objective:  There were no vitals filed for this visit.  General: Well developed, nourished, in no acute distress, alert and oriented x3   Dermatological: Skin is warm, dry and supple bilateral. Nails x 10 are well  maintained; remaining integument appears unremarkable at this time. There are no open sores, no preulcerative lesions, no rash or signs of infection present.  Though they are thick yellow dystrophic and sharply incurvated.  Vascular: Dorsalis Pedis artery and Posterior Tibial artery pedal pulses are 2/4 bilateral with immedate capillary fill time. Pedal hair growth present. No varicosities and no lower extremity edema present bilateral.   Neruologic: Grossly intact via light touch bilateral. Vibratory intact via tuning fork bilateral. Protective threshold with Semmes Wienstein monofilament intact to all pedal sites bilateral. Patellar and Achilles deep tendon reflexes 2+ bilateral. No Babinski or clonus noted bilateral.   Musculoskeletal: No gross boney pedal deformities bilateral. No pain, crepitus, or limitation noted with foot and ankle range of motion bilateral. Muscular strength 5/5 in all groups tested bilateral.  Gait: Unassisted, Nonantalgic.    Radiographs:  None taken  Assessment & Plan:   Assessment: Pain in limb secondary to onychomycosis nail dystrophy  Plan: Debridement of toenails 1 through 5 bilateral.     Whitney [name], [place]

## 2024-06-16 ENCOUNTER — Encounter: Payer: Self-pay | Admitting: Podiatry

## 2024-06-16 ENCOUNTER — Ambulatory Visit: Admitting: Podiatry

## 2024-06-16 DIAGNOSIS — M79676 Pain in unspecified toe(s): Secondary | ICD-10-CM | POA: Diagnosis not present

## 2024-06-16 DIAGNOSIS — E119 Type 2 diabetes mellitus without complications: Secondary | ICD-10-CM | POA: Diagnosis not present

## 2024-06-16 DIAGNOSIS — B351 Tinea unguium: Secondary | ICD-10-CM

## 2024-06-16 DIAGNOSIS — M722 Plantar fascial fibromatosis: Secondary | ICD-10-CM

## 2024-06-16 NOTE — Patient Instructions (Signed)
 TRY ON AND PURCHASE SNEAKERS FROM CHOICES BELOW:   NEW BALANCE SNEAKERS 800 SERIES OR HIGHER 2. BROOKS SNEAKERS   [facility] [address] [place], [place] [address] [phone]  SEE DR. HYATT OR DR. ANDREW REGAL IF YOU CONTINUE TO HAVE ARCH PAIN  Plantar Fasciitis: What to Know  Your plantar fascia is a band of thick tissue on the bottom of your foot. It connects your heel bone to the base of your toes. If the fascia gets irritated, it can cause pain in your heel or foot. This is called plantar fasciitis. In some cases, plantar fasciitis can make it hard for you to walk or move. The pain is often worse in the morning after sleeping, or after sitting or lying down for a long time. Pain may also be worse after walking or standing for a long time. What are the causes? Plantar fasciitis may be caused by: Standing for a long time. Wearing shoes that don't have good arch support. Doing high-impact activities. These are things that put stress on your joints. They include: Ballet. Aerobic exercises. These are exercises that make your heart beat faster. Being overweight. Having a way of walking, or gait, that isn't normal. Tight muscles in your calf, which is in the back of your lower leg. High arches in your feet, or flat feet. Starting a new sport or activity. What are the signs or symptoms? The main symptom of plantar fasciitis is heel pain. Your pain may get worse after: You take your first steps after a time of rest. This includes in the morning after you wake up, or after you've been sitting or lying down for a while. Standing still for a long time. Pain may lessen after 30-45 minutes of activity, such as gentle walking. How is this diagnosed? Plantar fasciitis may be diagnosed based on your medical history, your symptoms, and an exam. Your health care provider will check for: A tender spot on the bottom of your foot. A high arch in your foot, or flat feet. Pain  when you move your foot. Trouble moving your foot. You may also have tests. These may include: X-rays. Ultrasound. MRI. How is this treated? Treatment depends on how bad your plantar fasciitis is. It may include: RICE therapy. This stands for rest, ice, pressure (compression), and raising (elevating) the foot. Exercises to stretch your calves and plantar fascia. A night splint. This holds your foot in a stretched, upward position while you sleep. Physical therapy. This can help with symptoms. It can also prevent problems in the future. Shots of a steroid medicine called cortisone. This can help with pain and irritation. Extracorporeal shock wave therapy. This uses electric shocks to stimulate your plantar fascia. If other treatments don't help, you may need to have surgery. Follow these instructions at home: Managing pain, stiffness, and swelling  Use ice, an ice pack, or a frozen bottle of water as told. Place a towel between your skin and the ice. Roll the bottom of your foot over the ice or frozen bottle. Do this for 20 minutes, 2-3 times a day. If your skin turns red, take off the ice right away to prevent skin damage. The risk of damage is higher if you can't feel pain, heat, or cold. Wear shoes that have air-sole or gel-sole cushions. You could also try soft shoe inserts made for plantar fasciitis. Activity Try not to do things that cause pain. Ask what things are safe for you to do. Exercise  as told. Try activities that are low impact. This means that they're easier on your joints. They include: Swimming. Water aerobics. Biking. General instructions Take your medicines only as told. Wear a night splint as told. Loosen the splint if your toes tingle, are numb, or turn cold and blue. Stay at a healthy weight. Work with your provider to lose weight as needed. Contact a health care provider if: Your symptoms don't go away with treatment. You have pain that gets worse. Your  pain makes it hard to move or do everyday things. This information is not intended to replace advice given to you by your health care provider. Make sure you discuss any questions you have with your health care provider. Document Revised: 11/23/2022 Document Reviewed: 11/23/2022 Elsevier Patient Education  2024 [employer].

## 2024-06-16 NOTE — Progress Notes (Signed)
 Subjective:  Patient ID: Whitney [name], female    DOB: 09-12-1940,  MRN: [id number]  Whitney [name] presents to clinic today for for annual diabetic foot examination and painful thick toenails that are difficult to trim. Pain interferes with ambulation. Aggravating factors include wearing enclosed shoe gear. Pain is relieved with periodic professional debridement. Patient also relates arch pain present for about two weeks in her left foot. She has h/o plantar fasciitis left foot and has had surgery for it in the past on her left foot. Today, she relates arch pain of the medial band proximal to insertion of the first metatarsal head. Chief Complaint  Patient presents with   Diabetes    DFC. She is unsure of her A1c. She saw Dr. Kylstra last week at [facility]. She mentions arch pain on the left foot x 2 weeks    PCP is Warren Cramp, MD.  Allergies[1]  Review of Systems: Negative except as noted in the HPI.  Objective: No changes noted in today's physical examination. There were no vitals filed for this visit. Whitney [name] is a pleasant 83 y.o. female WD, WN in NAD. AAO x 3.   Diabetic foot exam was performed with the following findings:   Vascular Examination: Capillary refill time immediate b/l. Palpable pedal pulses. Pedal hair present b/l. Pedal edema absent. No pain with calf compression b/l. Skin temperature gradient WNL b/l. No cyanosis or clubbing b/l. No ischemia or gangrene noted b/l.   Neurological Examination: Sensation grossly intact b/l with 10 gram monofilament. Vibratory sensation intact b/l.   Dermatological Examination: Pedal skin with normal turgor, texture and tone b/l.  No open wounds. No interdigital macerations.   Toenails 1-5 b/l thick, discolored, elongated with subungual debris and pain on dorsal palpation.   No hyperkeratotic nor porokeratotic lesions.  Musculoskeletal Examination: Muscle strength 5/5 to all lower extremity muscle groups bilaterally. No  pain, crepitus or joint limitation noted with ROM b/l LE. Mild pes planus deformity noted bilateral LE. Wearing flat  [employer] sneakers with no arch support today. Arch pain along medial band of plantar fascia proximal to insertion into 1st metatarsal head. No erythema, no edema, no fluctuance.  Radiographs: None  Assessment/Plan: 1. Pain due to onychomycosis of toenail   2. Plantar fasciitis, left   3. Diabetes mellitus type 2, controlled, without complications (HCC)   -Patient was evaluated today. All questions/concerns addressed on today's visit. -Examined patient. -Toenails 1-5 b/l were debrided in length and girth with sterile nail nippers and dremel without iatrogenic bleeding.  -Shoe recommendations given for [employer] or [employer]. -Discussed plantar fasciitis left foot and recommendation for change in shoe gear to above as well as arch support with Powerstep Arch Supports. We also discussed benefit of diabetic shoes, but she would like to start with sneakers and Powersteps for now. If she continues to be symptomatic, she may follow up with Drs. Hyatt or Regal in [place] office. -Patient/POA to call should there be question/concern in the interim.   Return in about 3 months (around 09/14/2024).  Whitney [name], DPM      [place] LOCATION: [address], [place] [address]  Office [phone]   [facility] LOCATION: [address] [place], [place] [address] Office [phone]     [1]  Allergies Allergen Reactions   Epinephrine Hypertension and Other (See Comments)    REACTION: causes BP to elevate  Other Reaction: CNS Disorder, Blood Pressure increased  Other reaction(s): Other (see comments)  REACTION: causes BP to elevate  Other Reaction: CNS Disorder, Blood Pressure increased  REACTION: causes BP to elevate  Other Reaction: CNS Disorder, Blood Pressure increased  REACTION:  causes BP to elevate  Other Reaction: CNS Disorder, Blood Pressure increased  REACTION: causes BP to elevate   Gluten Meal Diarrhea and Other (See Comments)    Sensitivity to Gluten  Other reaction(s): Other (See Comments)  Sensitivity to Gluten  Sensitivity to Gluten  Sensitivity to Gluten  Sensitivity to Gluten  Wheat intolerance   Wheat intolerance   Hydrocodone Rash and Itching    Pt states she can take   Lactose Other (See Comments)    Lactose Intolerance   Lactose intolerance diarrhea.  GI distress  Other reaction(s): GI intolerance  Lactose Intolerance   Lactose intolerance diarrhea.  Lactose Intolerance   Lactose intolerance diarrhea.   Nsaids Hypertension and Other (See Comments)   Oxycodone Itching and Rash    Pt states she can take   Propoxyphene Other (See Comments)    REACTION: caused BP to elevate  Other Reaction: CNS Disorder  REACTION: caused BP to elevate Other Reaction: CNS Disorder REACTION: caused BP to elevate REACTION: caused BP to elevate Other Reaction: CNS Disorder  REACTION: caused BP to elevate Other Reaction: CNS Disorder REACTION: caused BP to elevate REACTION: caused BP to elevate Other Reaction: CNS Disorder    REACTION: caused BP to elevate Other Reaction: CNS Disorder REACTION: caused BP to elevate REACTION: caused BP to elevate Other Reaction: CNS Disorder   Penicillins Hives, Nausea Only and Rash    REACTION: rash  REACTION: rash  REACTION: rash  REACTION: rash  REACTION: rash  REACTION: rash  REACTION: rash   Sulfa Antibiotics Rash   Chlorthalidone Other (See Comments)    Hyponatremia Hyponatremia Other reaction(s): Other (see comments) Hyponatremia Hyponatremia More of a intolerance per pt  Hyponatremia Hyponatremia More of a intolerance per pt  Hyponatremia Hyponatremia    Codeine Itching   Ibuprofen Other (See Comments)    REACTION: causes BP to elevate  Other reaction(s): Other (see comments)  REACTION:  causes BP to elevate  REACTION: causes BP to elevate  REACTION: causes BP to elevate  REACTION: causes BP to elevate  REACTION: causes BP to elevate  Pt states 05/14/22 she can take   Iohexol  Other (See Comments)    Code: RASH, Desc: IV CONTRAST REACTION 18YRS AGO, OK TODAY W/ 25MG  BENADRYL  PO JUST PRIOR TO EXAM/A.C., Onset Date: 91778022  Code: RASH, Desc: IV CONTRAST REACTION 18YRS AGO, OK TODAY W/ 25MG  BENADRYL  PO JUST PRIOR TO EXAM/A.C., Onset Date: 91778022    Code: RASH, Desc: IV CONTRAST REACTION 18YRS AGO, OK TODAY W/ 25MG  BENADRYL  PO JUST PRIOR TO EXAM/A.C., Onset Date: 91778022  Code: RASH, Desc: IV CONTRAST REACTION 18YRS AGO, OK TODAY W/ 25MG  BENADRYL  PO JUST PRIOR TO EXAM/A.C., Onset Date: 91778022  Code: RASH, Desc: IV CONTRAST REACTION 18YRS AGO, OK TODAY W/ 25MG  BENADRYL  PO JUST PRIOR TO EXAM/A.C., Onset Date: 91778022   Code: RASH, Desc: IV CONTRAST REACTION 18YRS AGO, OK TODAY W/ 25MG  BENADRYL  PO JUST PRIOR TO EXAM/A.C., Onset Date: 91778022   Propoxyphene N-Acetaminophen   REACTION: caused BP to elevate   Tramadol     Other reaction(s): Insomnia   Dexamethasone  Rash   Hydrocodone-Acetaminophen  Rash    Pt states she can take   Iodinated Contrast Media Rash    Pt is allergic to iodinated contrast media. Pt had a contrast reaction.   Nitrofurantoin Nausea Only    Nausea/diarrhea  Nausea/diarrhea Nausea/diarrhea Nausea/diarrhea Nausea/diarrhea    Nausea/diarrhea   Other Rash

## 2024-06-20 NOTE — Progress Notes (Unsigned)
 Cardiology Office Note  Date:  06/23/2024   ID:  [name], DOB 10-14-1940, MRN [id number]  PCP:  Warren Cramp, MD   Chief Complaint  Patient presents with   6 month follow up     Patient denies any cardiac concerns. Patient took a fall in April 2025 with a brain injury with having a brain surgery at [facility] in April 2025.    HPI:  Ms. Whitney [name] is a 83 year old woman with past medical history of Diabetes type 2 Liver cirrhosis/NASH Former smoker 14 years quit 1977 Brain surgery in 2023 x2, cavernous malformations Hx of seizures, gets lightheaded, poor balance, uses a cane 3 brain surgeries Who presents by referral from Dr. Kylstra for hypertension, hyperlipidemia, aortic calcification, coronary calcification  LOV 2/25 Recent events from 2025 discussed in detail Fall 10/28/23 seen in er Seen at [facility], follow-up MRI January 05, 2024, lesions noted July 9th brain surgery at [facility], mass resection Following surgery reports having issues with Anxiety , takes lorazepam  take 1-3 a day  Long rehab, now walking better with cane, No falls Goes to mall Does not drive, husband drives  Weight down 7 pounds, diet is getting better, slowly regaining weight  Followed by GI for NASH, cirrhosis on MRI  Denies significant chest pain or shortness of breath on exertion  Blood pressure well-controlled at home Off carvedilol  Remains on olmesartan, amlodipine   Prior imaging reviewed CT scan chest April 30, 2022, Showing diffuse moderate aortic atherosclerosis, diffuse moderate three-vessel coronary artery calcification, heavy mitral annular calcification, cirrhosis  Lab work reviewed A1c 5.5 Total cholesterol 162 LDL 79  EKG personally reviewed by myself on todays visit EKG Interpretation Date/Time:  Friday June 23 2024 17:01:49 EST Ventricular Rate:  70 PR Interval:  164 QRS Duration:  86 QT Interval:  396 QTC Calculation: 427 R Axis:   -12  Text Interpretation: Normal  sinus rhythm Possible Left atrial enlargement When compared with ECG of 28-Oct-2023 11:40, No significant change was found Confirmed by Perla Lye [id number]) on 06/23/2024 5:27:18 PM    PMH:   has a past medical history of Allergy, Cancer (HCC) (2007), Diabetes mellitus without complication (HCC), Fibromuscular dysplasia, Hypertension, NASH (nonalcoholic steatohepatitis), and Stroke (HCC).  PSH:    Past Surgical History:  Procedure Laterality Date   BREAST EXCISIONAL BIOPSY Bilateral 60's, 70's, 80's   Benign    Current Outpatient Medications  Medication Sig Dispense Refill   amLODipine  (NORVASC ) 10 MG tablet Take 10 mg by mouth daily.     ascorbic acid (VITAMIN C) 500 MG tablet Take 500 mg by mouth daily.     Cholecalciferol (VITAMIN D3) 2000 UNITS capsule Take 2,000 Units by mouth daily.     clobetasol (TEMOVATE) 0.05 % external solution Apply 1 Application topically 2 (two) times daily.     escitalopram  (LEXAPRO ) 10 MG tablet Take 10 mg by mouth daily.     estradiol (ESTRACE) 0.1 MG/GM vaginal cream Place 1 Applicatorful vaginally 2 (two) times a week.     Ferrous Sulfate  Dried 143 (45 Fe) MG TBCR Take by mouth daily.     fexofenadine (ALLEGRA) 180 MG tablet Take 180 mg by mouth daily as needed for allergies.     fluticasone (FLONASE) 50 MCG/ACT nasal spray Place 1 spray into both nostrils daily.     gabapentin  (NEURONTIN ) 300 MG capsule Take 300 mg by mouth 3 (three) times daily.     levETIRAcetam  (KEPPRA  XR) 750 MG 24 hr tablet Take 750 mg by  mouth daily.     metFORMIN (GLUCOPHAGE-XR) 500 MG 24 hr tablet Take 500-1,000 mg by mouth 2 (two) times daily. 500 mg every morning and 1000 mg every evening     metroNIDAZOLE (METROCREAM) 0.75 % cream Apply 1 Application topically 2 (two) times daily.     mirtazapine (REMERON) 7.5 MG tablet Take 7.5 mg by mouth at bedtime.     mometasone (ELOCON) 0.1 % cream Apply 1 Application topically daily.     olmesartan (BENICAR) 20 MG tablet Take 20  mg by mouth 2 (two) times daily.     omeprazole (PRILOSEC) 20 MG capsule Take 20 mg by mouth daily.     pravastatin  (PRAVACHOL ) 10 MG tablet Take 10 mg by mouth daily.     traZODone  (DESYREL ) 50 MG tablet Take 50 mg by mouth at bedtime.     Blood Glucose Monitoring Suppl (FIFTY50 GLUCOSE METER 2.0) w/Device KIT Frequency:PHARMDIR   Dosage:0.0     Instructions:  Note:microfill contour strips BID Dose: 1 (Patient not taking: Reported on 06/23/2024)     clonazePAM (KLONOPIN) 0.5 MG tablet Take 0.5 mg by mouth 2 (two) times daily as needed for anxiety. (Patient not taking: Reported on 08/20/2023)     methocarbamol (ROBAXIN) 500 MG tablet TAKE 1 TABLET BY MOUTH FOUR TIMES A DAY AS NEEDED     ondansetron  (ZOFRAN -ODT) 8 MG disintegrating tablet Take 1 tablet (8 mg total) by mouth every 8 (eight) hours as needed for nausea or vomiting. 20 tablet 0   sodium chloride  1 g tablet Take 1 tablet (1 g total) by mouth 2 (two) times daily with a meal. 30 tablet 0   No current facility-administered medications for this visit.    Allergies:   Epinephrine, Gluten meal, Hydrocodone, Lactose, Nsaids, Oxycodone, Propoxyphene, Penicillins, Sulfa antibiotics, Chlorthalidone, Ibuprofen, Iohexol , Propoxyphene n-acetaminophen , Tramadol, Codeine, Dexamethasone , Hydrocodone-acetaminophen , Iodinated contrast media, Nitrofurantoin, and Other   Social History:  The patient  reports that she quit smoking about 48 years ago. Her smoking use included cigarettes. She has never used smokeless tobacco. She reports current alcohol use. She reports that she does not use drugs.   Family History:   family history includes Heart attack (age of onset: 40) in her father; Hyperlipidemia in her father; Hypertension in her father.    Review of Systems: Review of Systems  Constitutional: Negative.   HENT: Negative.    Respiratory: Negative.    Cardiovascular: Negative.   Gastrointestinal: Negative.   Musculoskeletal: Negative.    Neurological: Negative.   Psychiatric/Behavioral: Negative.    All other systems reviewed and are negative.  PHYSICAL EXAM: VS:  BP 122/62 (BP Location: Left Arm, Patient Position: Sitting, Cuff Size: Normal)   Pulse 70   Ht 5' 5.5 (1.664 m)   Wt 142 lb 2 oz (64.5 kg)   SpO2 96%   BMI 23.29 kg/m  , BMI Body mass index is 23.29 kg/m. GEN: Well nourished, well developed, in no acute distress HEENT: normal Neck: no JVD, carotid bruits, or masses Cardiac: RRR; no murmurs, rubs, or gallops,no edema  Respiratory:  clear to auscultation bilaterally, normal work of breathing GI: soft, nontender, nondistended, + BS MS: no deformity or atrophy Skin: warm and dry, no rash Neuro:  Strength and sensation are intact Psych: euthymic mood, full affect   Recent Labs: 10/28/2023: ALT 32; BUN 22; Hemoglobin 13.1; Platelets 156; Potassium 5.3; Sodium 121 12/17/2023: Creatinine, Ser 0.70    Lipid Panel Lab Results  Component Value Date   CHOL  149 04/30/2022   HDL 51 04/30/2022   LDLCALC 82 04/30/2022   TRIG 78 04/30/2022    Wt Readings from Last 3 Encounters:  06/23/24 142 lb 2 oz (64.5 kg)  10/28/23 158 lb (71.7 kg)  08/20/23 149 lb 2 oz (67.6 kg)     ASSESSMENT AND PLAN:  Problem List Items Addressed This Visit       Cardiology Problems   HTN (hypertension)   Relevant Orders   EKG 12-Lead (Completed)   Coronary artery disease of native artery of native heart with stable angina pectoris - Primary   Relevant Medications   mirtazapine (REMERON) 7.5 MG tablet   Other Relevant Orders   EKG 12-Lead (Completed)   Hyperlipidemia, unspecified     Other   Diabetes mellitus type 2, controlled, without complications (HCC)   OSA on CPAP    Coronary calcification Noted on CT scan from October 2023,  Currently with no symptoms of angina. No further workup at this time. Continue current medication regimen.  No further testing at this time Remains on pravastatin , not on  aspirin   Aortic atherosclerosis Moderate diffuse disease noted on CT imaging On statin, She reports that she is off aspirin   Diabetes type 2 A1c well-controlled, managed by primary care  Hyperlipidemia Currently taking pravastatin  10 mg daily  Essential hypertension Blood pressure is well controlled on today's visit. No changes made to the medications. Recommend close monitoring of blood pressure at home, will try to avoid hypotension   Signed, Velinda Lunger, M.D., Ph.D. [facility] [employer], [place] [phone]

## 2024-06-23 ENCOUNTER — Ambulatory Visit: Attending: Cardiovascular Disease | Admitting: Cardiovascular Disease

## 2024-06-23 ENCOUNTER — Encounter: Payer: Self-pay | Admitting: Cardiovascular Disease

## 2024-06-23 VITALS — BP 122/62 | HR 70 | Ht 65.5 in | Wt 142.1 lb

## 2024-06-23 DIAGNOSIS — I10 Essential (primary) hypertension: Secondary | ICD-10-CM | POA: Insufficient documentation

## 2024-06-23 DIAGNOSIS — I25118 Atherosclerotic heart disease of native coronary artery with other forms of angina pectoris: Secondary | ICD-10-CM | POA: Diagnosis not present

## 2024-06-23 DIAGNOSIS — E119 Type 2 diabetes mellitus without complications: Secondary | ICD-10-CM | POA: Diagnosis not present

## 2024-06-23 DIAGNOSIS — E782 Mixed hyperlipidemia: Secondary | ICD-10-CM | POA: Diagnosis not present

## 2024-06-23 DIAGNOSIS — G4733 Obstructive sleep apnea (adult) (pediatric): Secondary | ICD-10-CM | POA: Diagnosis not present

## 2024-06-23 NOTE — Patient Instructions (Signed)

## 2024-09-15 ENCOUNTER — Ambulatory Visit: Admitting: Podiatry
